# Patient Record
Sex: Male | Born: 1945
Health system: Southern US, Community
[De-identification: ages and names within clinical notes are randomized; demographics above are authoritative.]

## PROBLEM LIST (undated history)

## (undated) DIAGNOSIS — I2510 Atherosclerotic heart disease of native coronary artery without angina pectoris: Secondary | ICD-10-CM

## (undated) DIAGNOSIS — C801 Malignant (primary) neoplasm, unspecified: Secondary | ICD-10-CM

## (undated) DIAGNOSIS — Z8673 Personal history of transient ischemic attack (TIA), and cerebral infarction without residual deficits: Secondary | ICD-10-CM

## (undated) DIAGNOSIS — R0602 Shortness of breath: Secondary | ICD-10-CM

## (undated) DIAGNOSIS — E785 Hyperlipidemia, unspecified: Secondary | ICD-10-CM

## (undated) DIAGNOSIS — C61 Malignant neoplasm of prostate: Secondary | ICD-10-CM

## (undated) DIAGNOSIS — I10 Essential (primary) hypertension: Secondary | ICD-10-CM

## (undated) DIAGNOSIS — I998 Other disorder of circulatory system: Secondary | ICD-10-CM

## (undated) DIAGNOSIS — I480 Paroxysmal atrial fibrillation: Secondary | ICD-10-CM

## (undated) DIAGNOSIS — D075 Carcinoma in situ of prostate: Secondary | ICD-10-CM

## (undated) DIAGNOSIS — E119 Type 2 diabetes mellitus without complications: Secondary | ICD-10-CM

## (undated) DIAGNOSIS — E669 Obesity, unspecified: Secondary | ICD-10-CM

## (undated) DIAGNOSIS — R002 Palpitations: Secondary | ICD-10-CM

## (undated) HISTORY — DX: Shortness of breath: R06.02

## (undated) HISTORY — DX: Essential (primary) hypertension: I10

## (undated) HISTORY — DX: Other disorder of circulatory system: I99.8

## (undated) HISTORY — DX: Carcinoma in situ of prostate: D07.5

## (undated) HISTORY — DX: Obesity, unspecified: E66.9

## (undated) HISTORY — DX: Personal history of transient ischemic attack (TIA), and cerebral infarction without residual deficits: Z86.73

## (undated) HISTORY — DX: Hyperlipidemia, unspecified: E78.5

## (undated) HISTORY — DX: Paroxysmal atrial fibrillation: I48.0

## (undated) HISTORY — DX: Palpitations: R00.2

---

## 2016-10-24 HISTORY — PX: PROSTATE SURGERY: SHX751

## 2017-04-24 ENCOUNTER — Telehealth: Payer: Self-pay

## 2017-04-24 ENCOUNTER — Telehealth: Payer: Self-pay | Admitting: Cardiovascular Disease

## 2017-04-24 NOTE — Telephone Encounter (Signed)
Received records from [place] for appointment on 05/13/17 with Dr Fletcher Anon.  Records put with Dr Tyrell Antonio schedule for 05/13/17. lp

## 2017-04-24 NOTE — Telephone Encounter (Signed)
SENT NOTES BY COURIER AND FAXED

## 2017-04-29 ENCOUNTER — Telehealth: Payer: Self-pay | Admitting: Cardiovascular Disease

## 2017-04-29 NOTE — Telephone Encounter (Signed)
04/29/17-Received records from [place] for upcoming appointment on 05/13/17 @ 9:00am with Dr. Fletcher Anon. Records given to [facility] in Medical Records. ab

## 2017-05-02 ENCOUNTER — Telehealth: Payer: Self-pay | Admitting: Cardiovascular Disease

## 2017-05-02 NOTE — Telephone Encounter (Signed)
New message   Pt calling and states there is an important part of his records that he needs to make sure was received. Requests a call back.

## 2017-05-06 ENCOUNTER — Ambulatory Visit (INDEPENDENT_AMBULATORY_CARE_PROVIDER_SITE_OTHER): Payer: Medicare PPO | Admitting: Cardiovascular Disease

## 2017-05-06 ENCOUNTER — Encounter: Payer: Self-pay | Admitting: Cardiovascular Disease

## 2017-05-06 VITALS — BP 122/76 | HR 67 | Ht 65.0 in | Wt 247.8 lb

## 2017-05-06 DIAGNOSIS — I480 Paroxysmal atrial fibrillation: Secondary | ICD-10-CM

## 2017-05-06 DIAGNOSIS — E785 Hyperlipidemia, unspecified: Secondary | ICD-10-CM

## 2017-05-06 DIAGNOSIS — I10 Essential (primary) hypertension: Secondary | ICD-10-CM | POA: Diagnosis not present

## 2017-05-06 MED ORDER — RIVAROXABAN 20 MG PO TABS: 20.0000 mg | ORAL_TABLET | Freq: Every day | ORAL | 5 refills | Status: DC

## 2017-05-06 NOTE — Patient Instructions (Signed)
Medication Instructions:  Your physician recommends that you continue on your current medications as directed. Please refer to the Current Medication list given to you today.  Follow-Up: Your physician wants you to follow-up in: 6 MONTHS with Dr. Arida. You will receive a reminder letter in the mail two months in advance. If you don't receive a letter, please call our office to schedule the follow-up appointment.   Any Other Special Instructions Will Be Listed Below (If Applicable).     If you need a refill on your cardiac medications before your next appointment, please call your pharmacy.   

## 2017-05-06 NOTE — Progress Notes (Signed)
Cardiology Office Note   Date:  05/06/2017   ID:  Steven Bury MD, DOB April 15, 1946, MRN [id number]  PCP:  Seward Carol, MD  Cardiologist:  New  No chief complaint on file.     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 He moved from [place]. He is a retired Software engineer. He has chronic medical conditions that include prostate cancer, essential hypertension, DM, previous TIA, hyperlipidemia and obesity. He had his previous cardiovascular care in [place]. He started having palpitations earlier this year with fatigue. He was initially seen by general cardiology with negative Holter monitor. He had a stress echocardiogram done which showed borderline ST depression in the inferior leads with no convincing evidence of ischemia. EF was reported to be normal. He continued to have palpitations. He was seen by EP (Dr. Adline Potter) in May of this year for abnormal Zio monitor. It showed multiple activation due to atrial flutter and fibrillation with a maximum heart rate of 250 bpm. Overall burden was 3%. He improved after switching carvedilol to metoprolol. It was recommended that he proceed with ablation of atrial flutter and atrial fibrillation.  Since switching to metoprolol, he felt significantly better with no palpitations at this time. No chest pain. He has chronic exertional dyspnea with no orthopnea or PND.    Past Medical History:  Diagnosis Date  . Carcinoma in situ of prostate   . History of transient ischemic attack (TIA)   . Hyperlipidemia   . Hypertension   . Ischemia   . Obesity   . PAF (paroxysmal atrial fibrillation) ([place])   . Palpitations   . SOB (shortness of breath)     Past Surgical History:  Procedure Laterality Date  . PROSTATE SURGERY  10/2016   Seeds implanted     Current Outpatient Prescriptions  Medication Sig Dispense Refill  . acetaminophen (TYLENOL) 325 MG tablet  Take 325 mg by mouth every 6 (six) hours as needed.    Marland Kitchen atorvastatin (LIPITOR) 20 MG tablet Take 20 mg by mouth daily.    Marland Kitchen lisinopril-hydrochlorothiazide (PRINZIDE,ZESTORETIC) 20-25 MG tablet Take 1 tablet by mouth daily.    . metFORMIN (GLUCOPHAGE) 500 MG tablet Take 500 mg by mouth 2 (two) times daily with a meal.    . metoprolol succinate (TOPROL-XL) 100 MG 24 hr tablet Take 100 mg by mouth daily. Take with or immediately following a meal.    . montelukast (SINGULAIR) 4 MG PACK Take 4 mg by mouth at bedtime.    . Multiple Vitamin (MULTIVITAMIN WITH MINERALS) TABS tablet Take 1 tablet by mouth daily.    . rivaroxaban (XARELTO) 20 MG TABS tablet Take 1 tablet (20 mg total) by mouth daily with supper. 30 tablet 5  . tamsulosin (FLOMAX) 0.4 MG CAPS capsule Take 0.4 mg by mouth daily.     No current facility-administered medications for this visit.     Allergies:   Patient has no known allergies.    Social History:  The patient  reports that he has never smoked. He has never used smokeless tobacco.   Family History:  The patient's family history includes Breast cancer in his mother; Diabetes in his father; Stroke in his father.    ROS:  Please see the history of present illness.   Otherwise, review of systems are positive for none.   All other systems are reviewed and negative.    PHYSICAL EXAM: VS:  BP 122/76 (BP Location: Right Arm, Cuff Size: Normal)   Pulse 67   Ht 5\' 5"  (1.651 m)   Wt 247 lb 12.8 oz (112.4 kg)   BMI 41.24 kg/m  , BMI Body mass index is 41.24 kg/m. GEN: Well nourished, well developed, in no acute distress  HEENT: normal  Neck: no JVD, carotid bruits, or masses Cardiac: RRR; no murmurs, rubs, or gallops,no edema  Respiratory:  clear to auscultation bilaterally, normal work of breathing GI: soft, nontender, nondistended, + BS MS: no deformity or atrophy  Skin: warm and dry, no rash Neuro:  Strength and sensation are intact Psych: euthymic mood, full  affect   EKG:  EKG is ordered today. The ekg ordered today demonstrates sinus rhythm with first-degree AV block. No significant ST or T wave changes.   Recent Labs: No results found for requested labs within last 8760 hours.    Lipid Panel No results found for: CHOL, TRIG, HDL, CHOLHDL, VLDL, LDLCALC, LDLDIRECT    Wt Readings from Last 3 Encounters:  05/06/17 247 lb 12.8 oz (112.4 kg)      Other studies Reviewed: Additional studies/ records that were reviewed today include: : Previous cardiac records from [place]. Review of the above records demonstrates:  Summarized above.  30 minutes was spent on reviewing his records.  PAD Screen 05/06/2017  Previous PAD dx? No  Previous surgical procedure? No  Pain with walking? No  Feet/toe relief with dangling? No  Painful, non-healing ulcers? No  Extremities discolored? No      ASSESSMENT AND PLAN:  1.  Paroxysmal atrial fibrillation/flutter: The burden has improved significantly since he was switched to metoprolol and currently he does not appear to be symptomatic. He needs to stay on long-term anticoagulation given high CHADS VAsc score. I discussed the option of referral to electrophysiology to consider ablation versus continued medical therapy. It appears that he had very good response with metoprolol and given that he is asymptomatic at the present time, I think it's reasonable continue medical therapy for now. He is going to have a physical in the near future and I recommend checking at least CBC and basic metabolic profile given that he is on anticoagulation.  2. Essential hypertension: Blood pressure is controlled on metoprolol and lisinopril hydrochlorothiazide.  3. Hyperlipidemia: Currently on atorvastatin 20 mg daily with a target LDL of less than 70 given diabetes.   Disposition:   FU with me in 6 months  Signed,  Kathlyn Sacramento, MD  05/06/2017 5:41 PM    [place]

## 2017-05-13 ENCOUNTER — Ambulatory Visit: Payer: Self-pay | Admitting: Cardiovascular Disease

## 2017-09-23 DIAGNOSIS — H409 Unspecified glaucoma: Secondary | ICD-10-CM

## 2017-09-23 HISTORY — DX: Unspecified glaucoma: H40.9

## 2017-10-24 DIAGNOSIS — C61 Malignant neoplasm of prostate: Secondary | ICD-10-CM | POA: Diagnosis not present

## 2017-10-24 DIAGNOSIS — I4891 Unspecified atrial fibrillation: Secondary | ICD-10-CM | POA: Diagnosis not present

## 2017-10-24 DIAGNOSIS — Z0000 Encounter for general adult medical examination without abnormal findings: Secondary | ICD-10-CM | POA: Diagnosis not present

## 2017-10-24 DIAGNOSIS — R413 Other amnesia: Secondary | ICD-10-CM | POA: Diagnosis not present

## 2017-10-24 DIAGNOSIS — R8290 Unspecified abnormal findings in urine: Secondary | ICD-10-CM | POA: Diagnosis not present

## 2017-10-24 DIAGNOSIS — Z1389 Encounter for screening for other disorder: Secondary | ICD-10-CM | POA: Diagnosis not present

## 2017-10-24 DIAGNOSIS — Z23 Encounter for immunization: Secondary | ICD-10-CM | POA: Diagnosis not present

## 2017-10-24 DIAGNOSIS — E119 Type 2 diabetes mellitus without complications: Secondary | ICD-10-CM | POA: Diagnosis not present

## 2017-10-24 DIAGNOSIS — I10 Essential (primary) hypertension: Secondary | ICD-10-CM | POA: Diagnosis not present

## 2017-10-24 DIAGNOSIS — R350 Frequency of micturition: Secondary | ICD-10-CM | POA: Diagnosis not present

## 2017-10-31 DIAGNOSIS — C61 Malignant neoplasm of prostate: Secondary | ICD-10-CM | POA: Diagnosis not present

## 2017-10-31 DIAGNOSIS — N3000 Acute cystitis without hematuria: Secondary | ICD-10-CM | POA: Diagnosis not present

## 2017-11-13 DIAGNOSIS — C61 Malignant neoplasm of prostate: Secondary | ICD-10-CM | POA: Diagnosis not present

## 2017-11-13 DIAGNOSIS — R300 Dysuria: Secondary | ICD-10-CM | POA: Diagnosis not present

## 2017-11-13 DIAGNOSIS — R413 Other amnesia: Secondary | ICD-10-CM | POA: Diagnosis not present

## 2017-11-13 DIAGNOSIS — Z923 Personal history of irradiation: Secondary | ICD-10-CM | POA: Diagnosis not present

## 2017-11-18 ENCOUNTER — Encounter (HOSPITAL_COMMUNITY): Payer: Self-pay | Admitting: Emergency Medicine

## 2017-11-18 ENCOUNTER — Emergency Department (HOSPITAL_COMMUNITY)
Admission: EM | Admit: 2017-11-18 | Discharge: 2017-11-18 | Disposition: A | Discharge status: Home / Self Care | Payer: Medicare PPO | Attending: Emergency Medicine | Admitting: Emergency Medicine

## 2017-11-18 DIAGNOSIS — Z7901 Long term (current) use of anticoagulants: Secondary | ICD-10-CM | POA: Insufficient documentation

## 2017-11-18 DIAGNOSIS — I10 Essential (primary) hypertension: Secondary | ICD-10-CM | POA: Diagnosis not present

## 2017-11-18 DIAGNOSIS — R300 Dysuria: Secondary | ICD-10-CM | POA: Diagnosis present

## 2017-11-18 DIAGNOSIS — N3000 Acute cystitis without hematuria: Secondary | ICD-10-CM | POA: Diagnosis not present

## 2017-11-18 DIAGNOSIS — Z7984 Long term (current) use of oral hypoglycemic drugs: Secondary | ICD-10-CM | POA: Insufficient documentation

## 2017-11-18 DIAGNOSIS — Z79899 Other long term (current) drug therapy: Secondary | ICD-10-CM | POA: Insufficient documentation

## 2017-11-18 DIAGNOSIS — R3914 Feeling of incomplete bladder emptying: Secondary | ICD-10-CM | POA: Diagnosis not present

## 2017-11-18 LAB — URINALYSIS, ROUTINE W REFLEX MICROSCOPIC
Bilirubin Urine: NEGATIVE
GLUCOSE, UA: NEGATIVE mg/dL
Ketones, ur: NEGATIVE mg/dL
NITRITE: POSITIVE — AB
PH: 5 (ref 5.0–8.0)
Protein, ur: 30 mg/dL — AB
Specific Gravity, Urine: 1.02 (ref 1.005–1.030)
Squamous Epithelial / LPF: NONE SEEN

## 2017-11-18 MED ORDER — CEFDINIR 300 MG PO CAPS: 300.0000 mg | ORAL_CAPSULE | Freq: Two times a day (BID) | ORAL | 0 refills | Status: DC

## 2017-11-18 MED ORDER — TRAMADOL HCL 50 MG PO TABS: 50.0000 mg | ORAL_TABLET | Freq: Four times a day (QID) | ORAL | 0 refills | Status: DC | PRN

## 2017-11-18 NOTE — ED Provider Notes (Addendum)
[facility] DEPT Provider Note   CSN: [id number] Arrival date & time: 11/18/17  1728    History   Chief Complaint Chief Complaint  Patient presents with  . 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 PMH significant for prostate cancer, hx of TIA, HTN, HLD, PAF on Xarelto. He states that ever since he had radioactive seed implants placed a year ago he's developed radiation urethritis and has recurrent UTIs. For the past month he's had a UTI. A culture done three weeks ago grew out Klebsiella. He sees Dr. Alinda Money with Urology and he's tried Cipro, Azithromycin, Doxy, Bactrim with no relief. He went to his urologists office today and had another urine culture collected. He was given rx for Bactrim. When he went home he had so much pain that he decided to come to the ED. He also spoke with Dr. Jess Barters who is on call at [facility] and he recommended for him to come to the ED for pain control. He denies fever, chills, flank pain, abdominal pain, N/V. He is taking Flomax and Pyridium for urine flow.  HPI  Past Medical History:  Diagnosis Date  . Carcinoma in situ of prostate   . History of transient ischemic attack (TIA)   . Hyperlipidemia   . Hypertension   . Ischemia   . Obesity   . PAF (paroxysmal atrial fibrillation) ([place])   . Palpitations   . SOB (shortness of breath)     There are no active problems to display for this patient.   Past Surgical History:  Procedure Laterality Date  . PROSTATE SURGERY  10/2016   Seeds implanted       Home Medications    Prior to Admission medications   Medication Sig Start Date End Date Taking? Authorizing Provider  acetaminophen (TYLENOL) 500 MG tablet Take 1,000 mg by mouth every 8 (eight) hours as needed for moderate pain.   Yes [provider]  atorvastatin (LIPITOR) 20 MG tablet Take 20 mg by mouth daily.   Yes [provider]  co-enzyme Q-10 30  MG capsule Take 30 mg by mouth daily.   Yes [provider]  lisinopril-hydrochlorothiazide (PRINZIDE,ZESTORETIC) 20-25 MG tablet Take 1 tablet by mouth daily.   Yes [provider]  metFORMIN (GLUCOPHAGE) 500 MG tablet Take 500 mg by mouth 2 (two) times daily with a meal.   Yes [provider]  metoprolol succinate (TOPROL-XL) 100 MG 24 hr tablet Take 100 mg by mouth daily. Take with or immediately following a meal.   Yes [provider]  Multiple Vitamin (MULTIVITAMIN WITH MINERALS) TABS tablet Take 1 tablet by mouth daily.   Yes [provider]  Nutritional Supplements (ANTI-INFLAMMATORY ENZYME) CAPS Take 1 capsule by mouth daily.   Yes [provider]  Omega-3 Fatty Acids (FISH OIL) 1000 MG CAPS Take 1 capsule by mouth daily.   Yes [provider]  rivaroxaban (XARELTO) 20 MG TABS tablet Take 1 tablet (20 mg total) by mouth daily with supper. 05/06/17  Yes Wellington Hampshire, MD  tamsulosin (FLOMAX) 0.4 MG CAPS capsule Take 0.4 mg by mouth 2 (two) times daily.    Yes [provider]  VENTOLIN HFA 108 (90 Base) MCG/ACT inhaler INL 1-2 PFS PO Q 6 H PRN SOB AND WHEEZING 08/14/17   [provider]    Family History Family History  Problem Relation Age of Onset  . Breast cancer  Mother   . Diabetes Father   . Stroke Father     Social History Social History   Tobacco Use  . Smoking status: Never Smoker  . Smokeless tobacco: Never Used  Substance Use Topics  . Alcohol use: Not on file  . Drug use: Not on file     Allergies   Patient has no known allergies.   Review of Systems Review of Systems  Constitutional: Negative for chills and fever.  Gastrointestinal: Negative for abdominal pain, nausea and vomiting.  Genitourinary: Positive for dysuria and frequency. Negative for flank pain, penile pain and testicular pain.  All other systems reviewed and are negative.    Physical Exam Updated Vital  Signs BP 133/85 (BP Location: Left Arm)   Pulse 77   Temp 98.1 F (36.7 C) (Oral)   Resp 18   SpO2 94%   Physical Exam  Constitutional: He is oriented to person, place, and time. He appears well-developed and well-nourished. No distress.  Obese. Calm, cooperative. Well-appearing  HENT:  Head: Normocephalic and atraumatic.  Eyes: Conjunctivae are normal. Pupils are equal, round, and reactive to light. Right eye exhibits no discharge. Left eye exhibits no discharge. No scleral icterus.  Neck: Normal range of motion.  Cardiovascular: Normal rate and regular rhythm.  Pulmonary/Chest: Effort normal and breath sounds normal. No respiratory distress.  Abdominal: Soft. Bowel sounds are normal. He exhibits no distension. There is no tenderness.  No CVA tenderness  Neurological: He is alert and oriented to person, place, and time.  Skin: Skin is warm and dry.  Psychiatric: He has a normal mood and affect. His behavior is normal.  Nursing note and vitals reviewed.    ED Treatments / Results  Labs (all labs ordered are listed, but only abnormal results are displayed) Labs Reviewed  URINALYSIS, ROUTINE W REFLEX MICROSCOPIC - Abnormal; Notable for the following components:      Result Value   Color, Urine AMBER (*)    APPearance CLOUDY (*)    Hgb urine dipstick MODERATE (*)    Protein, ur 30 (*)    Nitrite POSITIVE (*)    Leukocytes, UA SMALL (*)    Bacteria, UA MANY (*)    All other components within normal limits    EKG  EKG Interpretation None       Radiology No results found.  Procedures Procedures (including critical care time)  Medications Ordered in ED Medications - No data to display   Initial Impression / Assessment and Plan / ED Course  I have reviewed the triage vital signs and the nursing notes.  Pertinent labs & imaging results that were available during my care of the patient were reviewed by me and considered in my medical decision making (see chart for  details).  72 year old male presents with recurrent UTI. He is well-appearing. Vitals are normal. Exam is unremarkable. Discussed with Dr. Billy Fischer. Will consult urology.  10:09 PM I spoke with Dr. Jess Barters who thinks his pain is multifactorial due to bladder spasms, infection, and mild urinary retention. He recommends Omnicef and office follow up. He's already given urine for a culture today. Will give rx for Omnicef and Tramadol and advised f/u with Urology.  Final Clinical Impressions(s) / ED Diagnoses   Final diagnoses:  Acute cystitis without hematuria    ED Discharge Orders    None         Recardo Evangelist, PA-C 11/18/17 2235    Gareth Morgan, MD 11/20/17 [phone]

## 2017-11-18 NOTE — ED Triage Notes (Signed)
Pt reports he has been having burning with urination for the past month. Has been to urology several times for same and tried several abx without relief. Pt having difficulty sleeping and relaxing due to pain. Called urology today, but was told to come here because the office was closed.

## 2017-11-18 NOTE — Discharge Instructions (Signed)
Take Cefdinir twice daily for one week Take Tramadol as needed for moderate-severe pain Follow up with Urology

## 2017-11-20 DIAGNOSIS — C61 Malignant neoplasm of prostate: Secondary | ICD-10-CM | POA: Diagnosis not present

## 2017-11-25 DIAGNOSIS — N3000 Acute cystitis without hematuria: Secondary | ICD-10-CM | POA: Diagnosis not present

## 2017-11-25 DIAGNOSIS — R3914 Feeling of incomplete bladder emptying: Secondary | ICD-10-CM | POA: Diagnosis not present

## 2017-11-27 DIAGNOSIS — I10 Essential (primary) hypertension: Secondary | ICD-10-CM | POA: Diagnosis not present

## 2017-11-27 DIAGNOSIS — Z8673 Personal history of transient ischemic attack (TIA), and cerebral infarction without residual deficits: Secondary | ICD-10-CM | POA: Diagnosis not present

## 2017-11-27 DIAGNOSIS — E119 Type 2 diabetes mellitus without complications: Secondary | ICD-10-CM | POA: Diagnosis not present

## 2017-11-27 DIAGNOSIS — Z923 Personal history of irradiation: Secondary | ICD-10-CM | POA: Diagnosis not present

## 2017-11-27 DIAGNOSIS — N35916 Unspecified urethral stricture, male, overlapping sites: Secondary | ICD-10-CM | POA: Diagnosis not present

## 2017-11-27 DIAGNOSIS — R3915 Urgency of urination: Secondary | ICD-10-CM | POA: Diagnosis not present

## 2017-11-27 DIAGNOSIS — N99112 Postprocedural membranous urethral stricture: Secondary | ICD-10-CM | POA: Diagnosis not present

## 2017-11-27 DIAGNOSIS — N35919 Unspecified urethral stricture, male, unspecified site: Secondary | ICD-10-CM | POA: Diagnosis not present

## 2017-11-27 DIAGNOSIS — Z8546 Personal history of malignant neoplasm of prostate: Secondary | ICD-10-CM | POA: Diagnosis not present

## 2017-11-28 ENCOUNTER — Encounter: Payer: Self-pay | Admitting: Neurology

## 2017-12-01 DIAGNOSIS — C61 Malignant neoplasm of prostate: Secondary | ICD-10-CM | POA: Diagnosis not present

## 2017-12-02 ENCOUNTER — Telehealth: Payer: Self-pay | Admitting: Cardiovascular Disease

## 2017-12-02 NOTE — Telephone Encounter (Signed)
° °  [name] Pre-operative Risk Assessment    Request for surgical clearance:  1. What type of surgery is being performed? Colonoscopy   2. When is this surgery scheduled? 12/18/17  3. What type of clearance is required (medical clearance vs. Pharmacy clearance to hold med vs. Both)? Pharmacy   4. Are there any medications that need to be held prior to surgery and how long? Asking about Xarelto    5. Practice name and name of physician performing surgery? [name] Dr [facility] [name]   6. What is your office phone and fax number? Fax [phone] Phone [phone]  7. Anesthesia type (None, local, MAC, general) ? Not listed

## 2017-12-02 NOTE — Telephone Encounter (Signed)
Patient last seen by Dr. Fletcher Anon- 05/06/2017. (He is seen at the [facility] office)  Dx- A-fib/ history of TIA  Patient is having a screening colonoscopy on 12/18/17. In regards to [place]- "can the patient temporarily stop the above medication before his/ her procedure, or after the procedure if polyps are removed."

## 2017-12-05 NOTE — Telephone Encounter (Signed)
Copy of phone encounter faxed to Dr. Pamalee Leyden [facility] at [phone]- fax confirmation received.

## 2017-12-05 NOTE — Telephone Encounter (Signed)
Hold Xarelto 2 days before colonoscopy and for a few days after the procedure if polyps are removed.

## 2017-12-14 ENCOUNTER — Emergency Department (HOSPITAL_COMMUNITY)
Admission: EM | Admit: 2017-12-14 | Discharge: 2017-12-14 | Disposition: A | Discharge status: Home / Self Care | Payer: Medicare PPO | Attending: Emergency Medicine | Admitting: Emergency Medicine

## 2017-12-14 DIAGNOSIS — Z7984 Long term (current) use of oral hypoglycemic drugs: Secondary | ICD-10-CM | POA: Diagnosis not present

## 2017-12-14 DIAGNOSIS — R339 Retention of urine, unspecified: Secondary | ICD-10-CM | POA: Diagnosis present

## 2017-12-14 DIAGNOSIS — Z79899 Other long term (current) drug therapy: Secondary | ICD-10-CM | POA: Insufficient documentation

## 2017-12-14 DIAGNOSIS — I10 Essential (primary) hypertension: Secondary | ICD-10-CM | POA: Diagnosis not present

## 2017-12-14 DIAGNOSIS — Y828 Other medical devices associated with adverse incidents: Secondary | ICD-10-CM | POA: Insufficient documentation

## 2017-12-14 DIAGNOSIS — Z8546 Personal history of malignant neoplasm of prostate: Secondary | ICD-10-CM | POA: Insufficient documentation

## 2017-12-14 DIAGNOSIS — T83091A Other mechanical complication of indwelling urethral catheter, initial encounter: Secondary | ICD-10-CM | POA: Insufficient documentation

## 2017-12-14 DIAGNOSIS — R300 Dysuria: Secondary | ICD-10-CM | POA: Diagnosis not present

## 2017-12-14 DIAGNOSIS — T83098A Other mechanical complication of other indwelling urethral catheter, initial encounter: Secondary | ICD-10-CM | POA: Diagnosis not present

## 2017-12-14 MED ORDER — OXYCODONE-ACETAMINOPHEN 5-325 MG PO TABS
1.0000 | ORAL_TABLET | Freq: Once | ORAL | Status: AC
Start: 2017-12-14 — End: 2017-12-14
  Administered 2017-12-14: 1 via ORAL
  Filled 2017-12-14: qty 1

## 2017-12-14 MED ORDER — SULFAMETHOXAZOLE-TRIMETHOPRIM 800-160 MG PO TABS
1.0000 | ORAL_TABLET | Freq: Once | ORAL | Status: AC
  Administered 2017-12-14: 1 via ORAL
  Filled 2017-12-14: qty 1

## 2017-12-14 NOTE — Discharge Instructions (Addendum)
Flush your catheter twice daily.  Get rechecked immediately if you develop fevers, can't urinate, uncontrolled pain or new concerning symptoms.

## 2017-12-14 NOTE — ED Notes (Signed)
Unable to flush urinary foley due to resistance. MD called to bedside for attempt.

## 2017-12-14 NOTE — ED Triage Notes (Signed)
Pt reports having a hx of prostate cancer. Pt contacted urology this morning and stated he had decreased output in his foley. Pt states he is unable to sit down due to pain.

## 2017-12-14 NOTE — ED Provider Notes (Signed)
[facility] DEPT Provider Note   CSN: [id number] Arrival date & time: 12/14/17  1339     History   Chief Complaint Chief Complaint  Patient presents with  . 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  The history is provided by the patient. No language interpreter was used.    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 He has a 12 French Foley catheter in place that was placed on March 7 in the operating room due to urethral stricture. He has a history of prostate cancer status post radiation treatment as well as atrial fibrillation on the Xarelto therapy. Since 5 AM he has been unable to pass urine through his Foley catheter and reports bladder spasms about every 15 minutes since that time. Prior to this his catheter was draining without difficulty. He did attempt to flush it with 30 mL of normal saline prior to ED arrival with no improvement in output. He is scheduled for suprapubic catheter placement next week. He denies any fevers, vomiting. The symptoms are severe and constant in nature.  Past Medical History:  Diagnosis Date  . Carcinoma in situ of prostate   . History of transient ischemic attack (TIA)   . Hyperlipidemia   . Hypertension   . Ischemia   . Obesity   . PAF (paroxysmal atrial fibrillation) ([place])   . Palpitations   . SOB (shortness of breath)     There are no active problems to display for this patient.   Past Surgical History:  Procedure Laterality Date  . PROSTATE SURGERY  10/2016   Seeds implanted        Home Medications    Prior to Admission medications   Medication Sig Start Date End Date Taking? Authorizing Provider  acetaminophen (TYLENOL) 500 MG tablet Take 1,000 mg by mouth every 8 (eight) hours as needed for moderate pain.   Yes [provider]  atorvastatin (LIPITOR) 20 MG tablet Take 20 mg by mouth daily.   Yes [provider]  co-enzyme Q-10 30  MG capsule Take 30 mg by mouth daily.   Yes [provider]  lisinopril-hydrochlorothiazide (PRINZIDE,ZESTORETIC) 20-25 MG tablet Take 1 tablet by mouth daily.   Yes [provider]  metFORMIN (GLUCOPHAGE) 500 MG tablet Take 500 mg by mouth 2 (two) times daily with a meal.   Yes [provider]  metoprolol succinate (TOPROL-XL) 100 MG 24 hr tablet Take 100 mg by mouth daily. Take with or immediately following a meal.   Yes [provider]  oxyCODONE (OXY IR/ROXICODONE) 5 MG immediate release tablet Take 5 mg by mouth every 4 (four) hours as needed for pain. 11/27/17  Yes [provider]  rivaroxaban (XARELTO) 20 MG TABS tablet Take 1 tablet (20 mg total) by mouth daily with supper. 05/06/17  Yes Wellington Hampshire, MD  sulfamethoxazole-trimethoprim (BACTRIM DS,SEPTRA DS) 800-160 MG tablet Take 1 tablet by mouth once.   Yes [provider]  tamsulosin (FLOMAX) 0.4 MG CAPS capsule Take 0.4 mg by mouth 2 (two) times daily.    Yes [provider]  traMADol (ULTRAM) 50 MG tablet Take 1 tablet (50 mg total) by mouth every 6 (six) hours as needed. 11/18/17  Yes Recardo Evangelist, PA-C  Turmeric 500 MG TABS Take 1,000 mg by mouth daily.    Yes [provider]  VENTOLIN HFA 108 (90 Base) MCG/ACT inhaler INL 1-2 PFS PO Q 6  H PRN SOB AND WHEEZING 08/14/17  Yes [provider]  cefdinir (OMNICEF) 300 MG capsule Take 1 capsule (300 mg total) by mouth 2 (two) times daily. Patient not taking: Reported on 12/14/2017 11/18/17   Recardo Evangelist, PA-C  Omega-3 Fatty Acids (FISH OIL) 1000 MG CAPS Take 1 capsule by mouth daily.    [provider]    Family History Family History  Problem Relation Age of Onset  . Breast cancer Mother   . Diabetes Father   . Stroke Father     Social History Social History   Tobacco Use  . Smoking status: Never Smoker  . Smokeless tobacco: Never Used  Substance Use Topics  . Alcohol use:  Not on file  . Drug use: Not on file     Allergies   Patient has no known allergies.   Review of Systems Review of Systems  All other systems reviewed and are negative.    Physical Exam Updated Vital Signs BP (!) 95/49   Pulse 62   Resp 18   SpO2 96%   Physical Exam  Constitutional: He is oriented to person, place, and time. He appears well-developed and well-nourished. He appears distressed.  Pacing the room. Uncomfortable appearing  HENT:  Head: Normocephalic and atraumatic.  Cardiovascular: Normal rate and regular rhythm.  No murmur heard. Pulmonary/Chest: Effort normal and breath sounds normal. No respiratory distress.  Abdominal: Soft. There is no rebound and no guarding.  Mild suprapubic tenderness  Genitourinary:  Genitourinary Comments: 12 French Foley catheter in place. A small amount of yellow urine in the bag. No urethral discharge.  Musculoskeletal: He exhibits no edema or tenderness.  Neurological: He is alert and oriented to person, place, and time.  Skin: Skin is warm and dry.  Psychiatric: He has a normal mood and affect. His behavior is normal.  Nursing note and vitals reviewed.    ED Treatments / Results  Labs (all labs ordered are listed, but only abnormal results are displayed) Labs Reviewed - No data to display  EKG None  Radiology No results found.  Procedures Procedures (including critical care time) EMERGENCY DEPARTMENT ULTRASOUND  Study: Limited Ultrasound of Bladder  INDICATIONS: to assess for urinary retention and/or bladder volume prior to urinary catheter Multiple views of the bladder were obtained in real-time in the transverse and longitudinal planes with a multi-frequency probe.  PERFORMED BY: Myself IMAGES ARCHIVED?: No LIMITATIONS:  Body habitus, discomfort INTERPRETATION: Moderate Volume 448mL      Medications Ordered in ED Medications  oxyCODONE-acetaminophen (PERCOCET/ROXICET) 5-325 MG per tablet 1 tablet (1  tablet Oral Given 12/14/17 1529)  sulfamethoxazole-trimethoprim (BACTRIM DS,SEPTRA DS) 800-160 MG per tablet 1 tablet (1 tablet Oral Given 12/14/17 1820)     Initial Impression / Assessment and Plan / ED Course  I have reviewed the triage vital signs and the nursing notes.  Pertinent labs & imaging results that were available during my care of the patient were reviewed by me and considered in my medical decision making (see chart for details).     Pt here for evaluation of decreased urinary output since early this morning, bladder spasms.  Beside US demonstrates acute urinary retention (initial with 600 in bladder).  Discussed with oncall Urologist, Dr. Lovena Neighbours - recommends flushing catheter.  Attempted to flush catheter multiple times.  Unable to flush but can draw back small amount of fluid.  After drawing back fluid patient did have slow flow of urine through his catheter.  His bladder  spasms resolved and he was able to rest comfortable.  He had about 300 mL out in foley bag.  Repeat bladder scan demonstrated some persistent fluid in bladder but significantly improved from previous.  Given bladder irrigation in ED Dr. Lovena Neighbours recommended one time dose of bactrim.  No historical features concerning for UTI or acute renal failure.  Discussed with patient importance of close Urology follow up, flushing catheter at home.  Return precautions discussed for evidence of recurrent obstruction, UTI.    Final Clinical Impressions(s) / ED Diagnoses   Final diagnoses:  Obstruction of Foley catheter, initial encounter [facility])    ED Discharge Orders    None       Quintella Reichert, MD 12/14/17 2020

## 2017-12-16 ENCOUNTER — Emergency Department (HOSPITAL_COMMUNITY): Payer: Medicare PPO

## 2017-12-16 ENCOUNTER — Encounter (HOSPITAL_COMMUNITY): Payer: Self-pay | Admitting: Obstetrics and Gynecology

## 2017-12-16 ENCOUNTER — Other Ambulatory Visit: Payer: Self-pay

## 2017-12-16 ENCOUNTER — Other Ambulatory Visit (HOSPITAL_COMMUNITY)
Admission: RE | Admit: 2017-12-16 | Discharge: 2017-12-16 | Disposition: A | Discharge status: Home / Self Care | Payer: Medicare PPO | Source: Ambulatory Visit

## 2017-12-16 ENCOUNTER — Emergency Department (HOSPITAL_COMMUNITY)
Admission: EM | Admit: 2017-12-16 | Discharge: 2017-12-16 | Disposition: A | Discharge status: Home / Self Care | Payer: Medicare PPO | Attending: Emergency Medicine | Admitting: Emergency Medicine

## 2017-12-16 DIAGNOSIS — R1084 Generalized abdominal pain: Secondary | ICD-10-CM | POA: Diagnosis not present

## 2017-12-16 DIAGNOSIS — E119 Type 2 diabetes mellitus without complications: Secondary | ICD-10-CM | POA: Insufficient documentation

## 2017-12-16 DIAGNOSIS — Z87448 Personal history of other diseases of urinary system: Secondary | ICD-10-CM

## 2017-12-16 DIAGNOSIS — R339 Retention of urine, unspecified: Secondary | ICD-10-CM | POA: Diagnosis not present

## 2017-12-16 DIAGNOSIS — Z8546 Personal history of malignant neoplasm of prostate: Secondary | ICD-10-CM | POA: Insufficient documentation

## 2017-12-16 DIAGNOSIS — N179 Acute kidney failure, unspecified: Secondary | ICD-10-CM | POA: Diagnosis not present

## 2017-12-16 DIAGNOSIS — R1030 Lower abdominal pain, unspecified: Secondary | ICD-10-CM | POA: Diagnosis not present

## 2017-12-16 DIAGNOSIS — Z09 Encounter for follow-up examination after completed treatment for conditions other than malignant neoplasm: Secondary | ICD-10-CM

## 2017-12-16 DIAGNOSIS — N35011 Post-traumatic bulbous urethral stricture: Secondary | ICD-10-CM | POA: Diagnosis not present

## 2017-12-16 DIAGNOSIS — Z7984 Long term (current) use of oral hypoglycemic drugs: Secondary | ICD-10-CM | POA: Insufficient documentation

## 2017-12-16 DIAGNOSIS — I10 Essential (primary) hypertension: Secondary | ICD-10-CM | POA: Diagnosis not present

## 2017-12-16 DIAGNOSIS — N3000 Acute cystitis without hematuria: Secondary | ICD-10-CM | POA: Diagnosis not present

## 2017-12-16 DIAGNOSIS — Z8673 Personal history of transient ischemic attack (TIA), and cerebral infarction without residual deficits: Secondary | ICD-10-CM | POA: Insufficient documentation

## 2017-12-16 DIAGNOSIS — Z466 Encounter for fitting and adjustment of urinary device: Secondary | ICD-10-CM | POA: Diagnosis not present

## 2017-12-16 DIAGNOSIS — R338 Other retention of urine: Secondary | ICD-10-CM | POA: Diagnosis not present

## 2017-12-16 DIAGNOSIS — Z79899 Other long term (current) drug therapy: Secondary | ICD-10-CM | POA: Diagnosis not present

## 2017-12-16 DIAGNOSIS — N35919 Unspecified urethral stricture, male, unspecified site: Secondary | ICD-10-CM | POA: Diagnosis not present

## 2017-12-16 HISTORY — DX: Type 2 diabetes mellitus without complications: E11.9

## 2017-12-16 HISTORY — DX: Malignant (primary) neoplasm, unspecified: C80.1

## 2017-12-16 HISTORY — DX: Malignant neoplasm of prostate: C61

## 2017-12-16 LAB — PROTIME-INR
INR: 1.26
Prothrombin Time: 15.7 seconds — ABNORMAL HIGH (ref 11.4–15.2)

## 2017-12-16 LAB — URINALYSIS, ROUTINE W REFLEX MICROSCOPIC
Bilirubin Urine: NEGATIVE
GLUCOSE, UA: NEGATIVE mg/dL
KETONES UR: NEGATIVE mg/dL
NITRITE: NEGATIVE
PH: 5 (ref 5.0–8.0)
Protein, ur: NEGATIVE mg/dL
SPECIFIC GRAVITY, URINE: 1.014 (ref 1.005–1.030)
Squamous Epithelial / LPF: NONE SEEN

## 2017-12-16 LAB — CBC WITH DIFFERENTIAL/PLATELET
BASOS PCT: 0 %
Basophils Absolute: 0 10*3/uL (ref 0.0–0.1)
Eosinophils Absolute: 0.1 10*3/uL (ref 0.0–0.7)
Eosinophils Relative: 0 %
HEMATOCRIT: 36.5 % — AB (ref 39.0–52.0)
Hemoglobin: 12 g/dL — ABNORMAL LOW (ref 13.0–17.0)
LYMPHS PCT: 11 %
Lymphs Abs: 1.4 10*3/uL (ref 0.7–4.0)
MCH: 29.9 pg (ref 26.0–34.0)
MCHC: 32.9 g/dL (ref 30.0–36.0)
MCV: 91 fL (ref 78.0–100.0)
Monocytes Absolute: 1.3 10*3/uL — ABNORMAL HIGH (ref 0.1–1.0)
Monocytes Relative: 10 %
NEUTROS ABS: 10 10*3/uL — AB (ref 1.7–7.7)
NEUTROS PCT: 79 %
Platelets: 307 10*3/uL (ref 150–400)
RBC: 4.01 MIL/uL — AB (ref 4.22–5.81)
RDW: 13.4 % (ref 11.5–15.5)
WBC: 12.7 10*3/uL — AB (ref 4.0–10.5)

## 2017-12-16 LAB — BASIC METABOLIC PANEL
ANION GAP: 15 (ref 5–15)
BUN: 47 mg/dL — ABNORMAL HIGH (ref 6–20)
CO2: 22 mmol/L (ref 22–32)
Calcium: 9.7 mg/dL (ref 8.9–10.3)
Chloride: 104 mmol/L (ref 101–111)
Creatinine, Ser: 1.69 mg/dL — ABNORMAL HIGH (ref 0.61–1.24)
GFR, EST AFRICAN AMERICAN: 46 mL/min — AB (ref >60)
GFR, EST NON AFRICAN AMERICAN: 39 mL/min — AB (ref >60)
GLUCOSE: 175 mg/dL — AB (ref 65–99)
POTASSIUM: 4.7 mmol/L (ref 3.5–5.1)
Sodium: 141 mmol/L (ref 135–145)

## 2017-12-16 MED ORDER — MIDAZOLAM HCL 2 MG/2ML IJ SOLN
INTRAMUSCULAR | Status: AC | PRN
  Administered 2017-12-16: 0.5 mg via INTRAVENOUS
  Administered 2017-12-16: 1 mg via INTRAVENOUS
  Administered 2017-12-16: 0.5 mg via INTRAVENOUS
  Administered 2017-12-16: 1 mg via INTRAVENOUS

## 2017-12-16 MED ORDER — MIDAZOLAM HCL 2 MG/2ML IJ SOLN
INTRAMUSCULAR | Status: AC
  Filled 2017-12-16: qty 6

## 2017-12-16 MED ORDER — SULFAMETHOXAZOLE-TRIMETHOPRIM 800-160 MG PO TABS: 1.0000 | ORAL_TABLET | Freq: Two times a day (BID) | ORAL | 0 refills | Status: AC

## 2017-12-16 MED ORDER — SODIUM CHLORIDE 0.9% FLUSH: 5.0000 mL | Freq: Three times a day (TID) | INTRAVENOUS | Status: DC

## 2017-12-16 MED ORDER — HYDROMORPHONE HCL 1 MG/ML IJ SOLN
1.0000 mg | Freq: Once | INTRAMUSCULAR | Status: AC
  Administered 2017-12-16: 1 mg via INTRAMUSCULAR
  Filled 2017-12-16: qty 1

## 2017-12-16 MED ORDER — BELLADONNA-OPIUM 16.2-30 MG RE SUPP: 30.0000 mg | Freq: Three times a day (TID) | RECTAL | 0 refills | Status: DC | PRN

## 2017-12-16 MED ORDER — SODIUM CHLORIDE 0.9 % IV BOLUS
1000.0000 mL | Freq: Once | INTRAVENOUS | Status: AC
  Administered 2017-12-16: 1000 mL via INTRAVENOUS

## 2017-12-16 MED ORDER — FENTANYL CITRATE (PF) 100 MCG/2ML IJ SOLN
INTRAMUSCULAR | Status: AC | PRN
  Administered 2017-12-16: 50 ug via INTRAVENOUS
  Administered 2017-12-16: 25 ug via INTRAVENOUS
  Administered 2017-12-16: 50 ug via INTRAVENOUS
  Administered 2017-12-16: 25 ug via INTRAVENOUS

## 2017-12-16 MED ORDER — CIPROFLOXACIN IN D5W 400 MG/200ML IV SOLN
400.0000 mg | Freq: Once | INTRAVENOUS | Status: AC
  Administered 2017-12-16: 400 mg via INTRAVENOUS
  Filled 2017-12-16: qty 200

## 2017-12-16 MED ORDER — BELLADONNA ALKALOIDS-OPIUM 16.2-60 MG RE SUPP: 1.0000 | Freq: Three times a day (TID) | RECTAL | Status: DC | PRN

## 2017-12-16 MED ORDER — FENTANYL CITRATE (PF) 100 MCG/2ML IJ SOLN
INTRAMUSCULAR | Status: AC
  Filled 2017-12-16: qty 6

## 2017-12-16 MED ORDER — HYDROMORPHONE HCL 1 MG/ML IJ SOLN
1.0000 mg | Freq: Once | INTRAMUSCULAR | Status: AC
  Administered 2017-12-16: 1 mg via INTRAVENOUS
  Filled 2017-12-16: qty 1

## 2017-12-16 NOTE — Sedation Documentation (Signed)
Pt states that his pain level is 10/10 and worse with laying down or sitting. Pain medication administered. Vitals stable at this time, MD Earleen Newport at beside

## 2017-12-16 NOTE — Sedation Documentation (Signed)
Patient is resting comfortably. 

## 2017-12-16 NOTE — ED Provider Notes (Signed)
72 yo M with h/o prostate CA with indwelling foley (8F), here with urinary retention. IR consulted, Urology consulted. Required OR with foley placement previously.   If pain controlled after IR, f/u with Dr. Francesca Jewett [phone] - Urologist at [facility].  Suprapubic catheter placed by IR uneventfully.  Patient tolerated well.  I reviewed his labs.  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  He has been given fluids here and is making good urine.  He will drink plenty of fluids at home and follow-up with his PCP and urologist.  I also discussed with his urologist Dr. Eli Hose.  Will place the patient on Bactrim, as well as give opium suppositories for bladder spasms.  Patient is very well-appearing and in no distress.   Duffy Bruce, MD 12/16/17 [phone]

## 2017-12-16 NOTE — Procedures (Signed)
Interventional Radiology Procedure Note  Procedure: CT guided suprapubic catheter.  76F catheter placed. .  Complications: None Recommendations:  - to gravity drain - His [facility] Urologist Dr. Francesca Jewett recommends removing the foley catheter now, though patient may not agree  - Follow up with [facility] urology as scheduled - 1 hour recovery before DC home - Currently is scheduled in 1 month with [facility]IR for upsize of the catheter, if not otherwise   Signed,  Dulcy Fanny. Earleen Newport, DO

## 2017-12-16 NOTE — Consult Note (Signed)
Chief Complaint: Patient was seen in consultation today for image guided suprapubic catheter placement Chief Complaint  Patient presents with  . 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  She has an existing 12 French Foley catheter in place since 11/27/17 due to urethral stricture.  Despite attempts at flushing Foley catheter only minimal amounts of urine have been passed.  Bladder scan revealed 700 cc urine.  Request now received for image guided suprapubic catheter placement.  Past Medical History:  Diagnosis Date  . Cancer ([place])   . Carcinoma in situ of prostate   . Diabetes mellitus without complication ([place])   . History of transient ischemic attack (TIA)   . Hyperlipidemia   . Hypertension   . Ischemia   . Obesity   . PAF (paroxysmal atrial fibrillation) ([place])   . Palpitations   . Prostate CA ([place])   . SOB (shortness of breath)     Past Surgical History:  Procedure Laterality Date  . PROSTATE SURGERY  10/2016   Seeds implanted    Allergies: Patient has no known allergies.  Medications: Prior to Admission medications   Medication Sig Start Date End Date Taking? Authorizing Provider  acetaminophen (TYLENOL) 500 MG tablet Take 1,000 mg by mouth every 8 (eight) hours as needed for moderate pain.   Yes [provider]  atorvastatin (LIPITOR) 20 MG tablet Take 20 mg by mouth daily.   Yes [provider]  co-enzyme Q-10 30 MG capsule Take 30 mg by mouth daily.   Yes [provider]  lisinopril-hydrochlorothiazide  (PRINZIDE,ZESTORETIC) 20-25 MG tablet Take 1 tablet by mouth daily.   Yes [provider]  metFORMIN (GLUCOPHAGE) 500 MG tablet Take 500 mg by mouth 2 (two) times daily with a meal.   Yes [provider]  metoprolol succinate (TOPROL-XL) 100 MG 24 hr tablet Take 100 mg by mouth daily. Take with or immediately following a meal.   Yes [provider]  Omega-3 Fatty Acids (FISH OIL) 1000 MG CAPS Take 1 capsule by mouth daily.   Yes [provider]  oxyCODONE (OXY IR/ROXICODONE) 5 MG immediate release tablet Take 5 mg by mouth every 4 (four) hours as needed for pain. 11/27/17  Yes [provider]  rivaroxaban (XARELTO) 20 MG TABS tablet Take 1 tablet (20 mg total) by mouth daily with supper. 05/06/17  Yes Wellington Hampshire, MD  sulfamethoxazole-trimethoprim (BACTRIM DS,SEPTRA DS) 800-160 MG tablet Take 1 tablet by mouth once.   Yes [provider]  tamsulosin (FLOMAX) 0.4 MG CAPS capsule Take 0.4 mg by mouth 2 (two) times daily.    Yes [provider]  traMADol (ULTRAM) 50 MG tablet Take 1 tablet (50 mg total) by mouth every 6 (six) hours as needed. 11/18/17  Yes Recardo Evangelist, PA-C  Turmeric 500 MG TABS Take 1,000 mg by mouth daily.    Yes [provider]  VENTOLIN HFA 108 (90 Base) MCG/ACT inhaler INL 1-2 PFS PO Q 6 H PRN SOB AND WHEEZING 08/14/17  Yes [provider]  cefdinir (OMNICEF) 300 MG capsule Take 1 capsule (300 mg total)  by mouth 2 (two) times daily. Patient not taking: Reported on 12/14/2017 11/18/17   Recardo Evangelist, PA-C     Family History  Problem Relation Age of Onset  . Breast cancer Mother   . Diabetes Father   . Stroke Father     Social History   Socioeconomic History  . Marital status: Married    Spouse name: Not on file  . Number of children: Not on file  . Years of education: Not on file  . Highest education level: Not on file  Occupational History  . Not on file  Social Needs  .  Financial resource strain: Not on file  . Food insecurity:    Worry: Not on file    Inability: Not on file  . Transportation needs:    Medical: Not on file    Non-medical: Not on file  Tobacco Use  . Smoking status: Never Smoker  . Smokeless tobacco: Never Used  Substance and Sexual Activity  . Alcohol use: Not on file  . Drug use: Not on file  . Sexual activity: Not on file  Lifestyle  . Physical activity:    Days per week: Not on file    Minutes per session: Not on file  . Stress: Not on file  Relationships  . Social connections:    Talks on phone: Not on file    Gets together: Not on file    Attends religious service: Not on file    Active member of club or organization: Not on file    Attends meetings of clubs or organizations: Not on file    Relationship status: Not on file  Other Topics Concern  . Not on file  Social History Narrative  . Not on file      Review of Systems denies  fever, headache, chest pain, cough, back pain, vomiting or abnormal bleeding.  He does have some dyspnea with exertion, tachycardia, nausea as well as diarrhea and pelvic discomfort/bladder spasms  Vital Signs: BP 137/82 (BP Location: Left Arm)   Pulse (!) 114   Resp 18   SpO2 92%   Physical Exam awake, alert.  Patient pacing the floor and very uncomfortable due to bladder spasms.  Chest clear to auscultation bilaterally.  Heart with tachycardic but regular rhythm.  Abdomen soft, positive bowel sounds, mild suprapubic tenderness, 12 French Foley in place with minimal amount of urine noted  Imaging: No results found.  Labs:  CBC: No results for input(s): WBC, HGB, HCT, PLT in the last 8760 hours.  COAGS: No results for input(s): INR, APTT in the last 8760 hours.  BMP: No results for input(s): NA, K, CL, CO2, GLUCOSE, BUN, CALCIUM, CREATININE, GFRNONAA, GFRAA in the last 8760 hours.  Invalid input(s): CMP  LIVER FUNCTION TESTS: No results for input(s): BILITOT, AST, ALT,  ALKPHOS, PROT, ALBUMIN in the last 8760 hours.  TUMOR MARKERS: No results for input(s): AFPTM, CEA, CA199, CHROMGRNA in the last 8760 hours.  Assessment and Plan:  72 y.o. male with past medical history significant for prostate cancer, status post radiation therapy and seed implantation, diabetes, atrial fibrillation on Xarelto-last dose this past Saturday, TIA, hypertension, hyperlipidemia recent presented to the ED with inability to pass urine Via Foley catheter.  She has an existing 12 French Foley catheter in place since 11/27/17 due to urethral stricture.  Despite attempts at flushing Foley catheter only minimal amounts of urine have been passed.  Bladder scan revealed 700 cc urine.  Request now  received for image guided suprapubic catheter placement.  Details/risks of procedure, including but not limited to, internal bleeding, infection, injury to adjacent structures, discussed with patient and wife with their understanding and consent.  Procedure scheduled for today as soon as possible.  Labs pend.  Thank you for this interesting consult.  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  A copy of this report was sent to the requesting provider on this date.  Electronically Signed: D. Rowe Robert, PA-C 12/16/2017, 4:33 PM   I spent a total of 25 minutes  in face to face in clinical consultation, greater than 50% of which was counseling/coordinating care for suprapubic catheter placement

## 2017-12-16 NOTE — ED Notes (Signed)
Bed: EE10 Expected date:  Expected time:  Means of arrival:  Comments: EMS-dysuria

## 2017-12-16 NOTE — ED Provider Notes (Signed)
[facility] DEPT Provider Note   CSN: [id number] Arrival date & time: 12/16/17  1109     History   Chief Complaint Chief Complaint  Patient presents with  . 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  History of prostate cancer s/p radiation with subsequent ureteral stricture s/p catheter placement (with difficulty under anesthesia in OR with 12 french catheter by urologist) by [facility] here with blocked urinary catheter x 8 hours.   Dysuria   This is a recurrent problem. The current episode started 6 to 12 hours ago. Episode frequency: consistently with catheter in place. The problem has been gradually worsening. The quality of the pain is described as aching and stabbing. The pain is moderate. There has been no fever. Pertinent negatives include no vomiting. He has tried nothing for the symptoms. His past medical history is significant for urological procedure and catheterization.    Past Medical History:  Diagnosis Date  . Cancer ([place])   . Carcinoma in situ of prostate   . Diabetes mellitus without complication ([place])   . History of transient ischemic attack (TIA)   . Hyperlipidemia   . Hypertension   . Ischemia   . Obesity   . PAF (paroxysmal atrial fibrillation) ([place])   . Palpitations   . Prostate CA ([place])   . SOB (shortness of breath)     There are no active problems to display for this patient.   Past Surgical History:  Procedure Laterality Date  . PROSTATE SURGERY  10/2016   Seeds implanted        Home Medications    Prior to Admission medications   Medication Sig Start Date End Date Taking? Authorizing Provider  acetaminophen (TYLENOL) 500 MG tablet Take 1,000 mg by mouth every 8 (eight) hours as needed for moderate pain.   Yes [provider]  atorvastatin (LIPITOR) 20 MG tablet Take 20 mg by mouth daily.   Yes [provider]  co-enzyme Q-10 30 MG capsule Take 30 mg by mouth daily.   Yes  [provider]  lisinopril-hydrochlorothiazide (PRINZIDE,ZESTORETIC) 20-25 MG tablet Take 1 tablet by mouth daily.   Yes [provider]  metFORMIN (GLUCOPHAGE) 500 MG tablet Take 500 mg by mouth 2 (two) times daily with a meal.   Yes [provider]  metoprolol succinate (TOPROL-XL) 100 MG 24 hr tablet Take 100 mg by mouth daily. Take with or immediately following a meal.   Yes [provider]  Omega-3 Fatty Acids (FISH OIL) 1000 MG CAPS Take 1 capsule by mouth daily.   Yes [provider]  oxyCODONE (OXY IR/ROXICODONE) 5 MG immediate release tablet Take 5 mg by mouth every 4 (four) hours as needed for pain. 11/27/17  Yes [provider]  rivaroxaban (XARELTO) 20 MG TABS tablet Take 1 tablet (20 mg total) by mouth daily with supper. 05/06/17  Yes Wellington Hampshire, MD  tamsulosin (FLOMAX) 0.4 MG CAPS capsule Take 0.4 mg by mouth 2 (two) times daily.    Yes [provider]  traMADol (ULTRAM) 50 MG tablet Take 1 tablet (50 mg total) by mouth every 6 (six) hours as needed. 11/18/17  Yes Recardo Evangelist, PA-C  Turmeric 500 MG TABS Take 1,000 mg by mouth daily.    Yes [provider]  VENTOLIN HFA 108 (90 Base) MCG/ACT inhaler INL 1-2 PFS PO Q 6 H PRN SOB AND WHEEZING 08/14/17  Yes [provider]  belladonna-opium (B&O SUPPRETTES) 16.2-30 MG suppository Place 1 suppository rectally every 8 (eight) hours as needed for pain. 12/16/17   Duffy Bruce, MD  cefdinir (OMNICEF) 300 MG capsule Take 1 capsule (300 mg total) by mouth 2 (two) times daily. Patient not taking: Reported on 12/14/2017 11/18/17   Recardo Evangelist, PA-C  sulfamethoxazole-trimethoprim (BACTRIM DS,SEPTRA DS) 800-160 MG tablet Take 1 tablet by mouth 2 (two) times daily for 7 days. 12/16/17 12/23/17  Duffy Bruce, MD    Family History Family History  Problem Relation Age of Onset  . Breast cancer Mother   . Diabetes Father   . Stroke Father     Social  History Social History   Tobacco Use  . Smoking status: Never Smoker  . Smokeless tobacco: Never Used  Substance Use Topics  . Alcohol use: Not on file  . Drug use: Not on file     Allergies   Patient has no known allergies.   Review of Systems Review of Systems  Gastrointestinal: Positive for abdominal pain. Negative for vomiting.  Genitourinary: Positive for dysuria.  All other systems reviewed and are negative.    Physical Exam Updated Vital Signs BP 135/78   Pulse (!) 112   Resp 20   SpO2 95%   Physical Exam  Constitutional: He appears well-developed and well-nourished. He appears distressed (2/2 pain, pacing back and forth in room).  HENT:  Head: Normocephalic and atraumatic.  Neck: Normal range of motion.  Cardiovascular: Normal rate.  Pulmonary/Chest: Effort normal. No respiratory distress.  Abdominal: He exhibits no distension. There is tenderness in the suprapubic area. There is no rigidity, no rebound and no guarding.  Musculoskeletal: Normal range of motion.  Neurological: He is alert.  Nursing note and vitals reviewed.    ED Treatments / Results  Labs (all labs ordered are listed, but only abnormal results are displayed) Labs Reviewed  CBC WITH DIFFERENTIAL/PLATELET - Abnormal; Notable for the following components:      Result Value   WBC 12.7 (*)    RBC 4.01 (*)    Hemoglobin 12.0 (*)    HCT 36.5 (*)    Neutro Abs 10.0 (*)    Monocytes Absolute 1.3 (*)    All other components within normal limits  BASIC METABOLIC PANEL - Abnormal; Notable for the following components:   Glucose, Bld 175 (*)    BUN 47 (*)    Creatinine, Ser 1.69 (*)    GFR calc non Af Amer 39 (*)    GFR calc Af Amer 46 (*)    All other components within normal limits  PROTIME-INR - Abnormal; Notable for the following components:   Prothrombin Time 15.7 (*)    All other components within normal limits  URINE CULTURE  URINALYSIS, ROUTINE W REFLEX MICROSCOPIC     EKG None  Radiology Ct Image Guided Fluid Drain By Catheter  Result Date: 12/16/2017 INDICATION: 72 year old male with a history of prostate carcinoma, urethral stricture, and current Foley catheter occlusion with urinary retention. EXAM: CT IMAGE GUIDED FLUID DRAIN BY CATHETER COMPARISON:  None. MEDICATIONS: 400 mg Cipro; The antibiotic was administered in an appropriate time frame prior to skin puncture. ANESTHESIA/SEDATION: Fentanyl 150 mcg IV; Versed 3.0 mg IV Moderate Sedation Time:  32 minutes The patient was continuously monitored during the procedure by the interventional radiology nurse under my direct supervision. CONTRAST:  None FLUOROSCOPY TIME:  CT COMPLICATIONS: None PROCEDURE: Informed written consent was obtained from the patient after a thorough discussion of  the procedural risks, benefits and alternatives. All questions were addressed. Maximal Sterile Barrier Technique was utilized including caps, mask, sterile gowns, sterile gloves, sterile drape, hand hygiene and skin antiseptic. A timeout was performed prior to the initiation of the procedure. Patient positioned supine position on the CT gantry table. CT scout CT performed for planning purposes. The patient is then prepped and draped in the usual sterile fashion. 1% lidocaine was used for local anesthesia. Using modified Seldinger technique, a 12 French pigtail drainage catheter was placed into the urinary bladder. Drain was sutured at the skin and attached to gravity drainage. Patient tolerated the procedure well and remained hemodynamically stable throughout. No complications were encountered and no significant blood loss. IMPRESSION: Status post placement of suprapubic catheter. Twelve French catheter placed. Signed, Dulcy Fanny. Earleen Newport DO [facility] Radiology PLAN: The patient has follow-up with his [facility] Urology team this Friday. At this time, he is currently on schedule with [facility] for  a 4 week follow-up with moderate sedation and up sizing to a 14 [place] catheter. Electronically Signed   By: Corrie Mckusick D.O.   On: 12/16/2017 18:27    Procedures Procedures (including critical care time)  Medications Ordered in ED Medications  midazolam (VERSED) 2 MG/2ML injection (has no administration in time range)  fentaNYL (SUBLIMAZE) 100 MCG/2ML injection (has no administration in time range)  opium-belladonna (B&O SUPPRETTES) 16.2-60 MG suppository 1 suppository (has no administration in time range)  sodium chloride flush (NS) 0.9 % injection 5 mL (has no administration in time range)  HYDROmorphone (DILAUDID) injection 1 mg (1 mg Intramuscular Given 12/16/17 1240)  HYDROmorphone (DILAUDID) injection 1 mg (1 mg Intravenous Given 12/16/17 1606)  ciprofloxacin (CIPRO) IVPB 400 mg (400 mg Intravenous New Bag/Given 12/16/17 1811)  sodium chloride 0.9 % bolus 1,000 mL (1,000 mLs Intravenous New Bag/Given 12/16/17 1811)  midazolam (VERSED) injection (1 mg Intravenous Given 12/16/17 1733)  fentaNYL (SUBLIMAZE) injection (50 mcg Intravenous Given 12/16/17 1733)     Initial Impression / Assessment and Plan / ED Course  I have reviewed the triage vital signs and the nursing notes.  Pertinent labs & imaging results that were available during my care of the patient were reviewed by me and considered in my medical decision making (see chart for details).     This patient had a prolonged ED stay secondary to >15 phone calls that were made to [facility] consult line (Dr. Otho Ket with on-call urology), our urologist here (Dr. Louis Meckel), patient's sister (Dr. Burt Knack), patient's urologist (Dr. Francesca Jewett) and our interventional Radiologist (Dr. Jacqualyn Posey) regarding relieving this patient's obstructed catheter after I could not do it. In brief, patient and his urologist were very hesitant and basically refused to let me and try and remove and replace as per our urologist recommendation. Ended up going to IR  to get suprapubic catheter placed which was scheduled for Friday anyway. Care transferred to Dr. Ellender Hose pending reevaluation after catheter placed for likely discharge.   Final Clinical Impressions(s) / ED Diagnoses   Final diagnoses:  Urinary retention  Acute cystitis without hematuria  AKI (acute kidney injury) [facility])    ED Discharge Orders        Ordered    sulfamethoxazole-trimethoprim (BACTRIM DS,SEPTRA DS) 800-160 MG tablet  2 times daily     12/16/17 1906    belladonna-opium (B&O SUPPRETTES) 16.2-30 MG suppository  Every 8 hours PRN     12/16/17 1906       Keylani Perlstein, [place],  MD 12/16/17 1918

## 2017-12-16 NOTE — Sedation Documentation (Signed)
Vital signs stable. Patient rates pain 5/10 on 1-10 scale

## 2017-12-16 NOTE — ED Notes (Signed)
Bladder scan for 356 ml.

## 2017-12-16 NOTE — Consult Note (Signed)
I have been asked to see the patient by Dr. Merrily Pew, for evaluation and management of occluded foley catheter.  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  The patient has a history of prostate cancer and underwent external beam radiation therapy with a brachytherapy boost partly one year ago.  He subsequently developed a radiation-induced bulbar urethral stricture.  He noted worsening urinary tract symptoms over the last several months.  He was seen in our office and there was one failed attempt at placing a Foley catheter.  He subsequently went back to his primary urologist in [facility] and ultimately in the operating room by 12 [place] Foley catheter was able to be passed beyond the patient stricture.  2 weeks later the patient developed an occluded catheter.  It has required irrigation on one another trip to the emergency room several days prior.  However, today they were unable to occlude the catheter.  The patient has not voided since 2 AM.  He is having severe suprapubic pain.  The bladder scan obtained at the time of presentation demonstrated 380 cc of urine.  Review of systems: A 12 point comprehensive review of systems was obtained and is negative unless otherwise stated in the history of present illness.  There are no active problems to display for this patient.   No current facility-administered medications on file prior to encounter.    Current Outpatient Medications on File Prior to Encounter  Medication Sig Dispense Refill  . acetaminophen (TYLENOL) 500 MG tablet Take 1,000 mg by mouth every 8 (eight) hours as needed for moderate pain.    Marland Kitchen atorvastatin (LIPITOR) 20 MG tablet Take 20 mg by mouth daily.    Marland Kitchen co-enzyme Q-10 30 MG capsule Take 30 mg by mouth daily.    Marland Kitchen lisinopril-hydrochlorothiazide (PRINZIDE,ZESTORETIC) 20-25 MG tablet Take 1  tablet by mouth daily.    . metFORMIN (GLUCOPHAGE) 500 MG tablet Take 500 mg by mouth 2 (two) times daily with a meal.    . metoprolol succinate (TOPROL-XL) 100 MG 24 hr tablet Take 100 mg by mouth daily. Take with or immediately following a meal.    . Omega-3 Fatty Acids (FISH OIL) 1000 MG CAPS Take 1 capsule by mouth daily.    Marland Kitchen oxyCODONE (OXY IR/ROXICODONE) 5 MG immediate release tablet Take 5 mg by mouth every 4 (four) hours as needed for pain.  0  . rivaroxaban (XARELTO) 20 MG TABS tablet Take 1 tablet (20 mg total) by mouth daily with supper. 30 tablet 5  . tamsulosin (FLOMAX) 0.4 MG CAPS capsule Take 0.4 mg by mouth 2 (two) times daily.     . traMADol (ULTRAM) 50 MG tablet Take 1 tablet (50 mg total) by mouth every 6 (six) hours as needed. 15 tablet 0  . Turmeric 500 MG TABS Take 1,000 mg by mouth daily.     . VENTOLIN HFA 108 (90 Base) MCG/ACT inhaler INL 1-2 PFS PO Q 6 H PRN SOB AND WHEEZING  1  . cefdinir (OMNICEF) 300 MG capsule Take 1 capsule (300 mg total) by mouth 2 (two) times daily. (Patient not taking: Reported on 12/14/2017) 14 capsule 0    Past Medical History:  Diagnosis Date  . Cancer ([place])   . Carcinoma in situ of prostate   . Diabetes mellitus without complication ([place])   . History of transient ischemic attack (TIA)   .  Hyperlipidemia   . Hypertension   . Ischemia   . Obesity   . PAF (paroxysmal atrial fibrillation) ([place])   . Palpitations   . Prostate CA ([place])   . SOB (shortness of breath)     Past Surgical History:  Procedure Laterality Date  . PROSTATE SURGERY  10/2016   Seeds implanted    Social History   Tobacco Use  . Smoking status: Never Smoker  . Smokeless tobacco: Never Used  Substance Use Topics  . Alcohol use: Not on file  . Drug use: Not on file    Family History  Problem Relation Age of Onset  . Breast cancer Mother   . Diabetes Father   . Stroke Father     PE: Vitals:   12/16/17 1735 12/16/17 1742 12/16/17 1754 12/16/17 1949   BP: 115/86 126/75 135/78 (!) 146/85  Pulse: (!) 105 (!) 103 (!) 112 100  Resp: 13 17 20 16   SpO2: 94% 98% 95% 96%   Patient appears to be in no acute distress  patient is alert and oriented x3 Atraumatic normocephalic head No cervical or supraclavicular lymphadenopathy appreciated No increased work of breathing, no audible wheezes/rhonchi Regular sinus rhythm/rate Abdomen is soft, nontender, nondistended, no CVA Which is tender.  The Foley catheter is draining no urine with only irrigated within the catheter bag. Lower extremities are symmetric without appreciable edema Grossly neurologically intact No identifiable skin lesions  Recent Labs    12/16/17 1604  WBC 12.7*  HGB 12.0*  HCT 36.5*   Recent Labs    12/16/17 1604  NA 141  K 4.7  CL 104  CO2 22  GLUCOSE 175*  BUN 47*  CREATININE 1.69*  CALCIUM 9.7   Recent Labs    12/16/17 1604  INR 1.26   No results for input(s): LABURIN in the last 72 hours. No results found for this or any previous visit.  Imaging: none  Imp/ Recommendations: The patient has a radiation-induced bulbar urethral stricture that has been difficult to catheterize.  He now has a 12 [place] Foley catheter and that is completely occluded.  In the emergency department the ER staff was unable to irrigate this catheter at all.   This very difficult situation, and likely is best treated in the short-term with a suprapubic catheter.  As such, I recommend the patient proceed to interventional radiology for stat placement of a SP tube.  Once the suprapubic tube is in place, his Foley catheter can be removed.  The patient has scheduled follow-up at [facility] in 3 days.  He should keep this follow-up for additional management options and strategy.  Louis Meckel W

## 2017-12-16 NOTE — Sedation Documentation (Signed)
Vital signs stable. 

## 2017-12-16 NOTE — ED Triage Notes (Signed)
Per EMS: Pt reports he is having an obstruction and has not been able to properly drain into his catheter since 2am this morning.  Pt reports pressure in his bladder, pt has a hx of prostate cancer.  Pt's wife reported to EMS that he is having surgery this Friday.

## 2017-12-16 NOTE — Sedation Documentation (Signed)
Report given to [facility] ED RN

## 2017-12-16 NOTE — Discharge Instructions (Signed)
Dr. Chrissie Noa, it was a pleasure taking care of you today.  I called Dr. Francesca Jewett after your cath placement and he recommends Bactrim, which I've prescribed.  I've also prescribed belladonna suppositories to help with bladder spasm.  Please return as needed, or call with any questions.

## 2017-12-16 NOTE — ED Notes (Signed)
Patient transported to IR and will return back to the room.

## 2017-12-16 NOTE — ED Notes (Signed)
Bladder scan for 697 ml .

## 2017-12-16 NOTE — ED Notes (Signed)
Per IR reported pt had suprapubic catheter place and can be discharged at 7 pm.  Pt also refused for existing foley catheter to be removed. Patient was given versed 3 mg and fentanyl 100 mcg during the procedure.

## 2017-12-16 NOTE — Sedation Documentation (Signed)
Pt refused removal of foley catheter

## 2017-12-16 NOTE — Sedation Documentation (Signed)
Pt tolerated procedure very well. Will remove foley catheter per Louis Meckel

## 2017-12-16 NOTE — ED Notes (Signed)
Patient agree to removed old foley cath. Foley cath removed at this time.

## 2017-12-17 MED FILL — SULFAMETHOXAZOLE-TMP DS TAB: 800-160 | 7 days supply | Qty: 14 | Fill #0

## 2017-12-18 LAB — URINE CULTURE: Culture: 60000 — AB

## 2017-12-25 ENCOUNTER — Telehealth: Payer: Self-pay | Admitting: Student

## 2017-12-25 ENCOUNTER — Other Ambulatory Visit (HOSPITAL_COMMUNITY): Payer: Self-pay | Admitting: Interventional Radiology

## 2017-12-25 DIAGNOSIS — N35011 Post-traumatic bulbous urethral stricture: Secondary | ICD-10-CM | POA: Diagnosis not present

## 2017-12-25 DIAGNOSIS — R339 Retention of urine, unspecified: Secondary | ICD-10-CM

## 2017-12-25 NOTE — Telephone Encounter (Signed)
PA received message that patient called with questions after his SP catheter placement on 12/16/17.  Called patient's number [phone]) with no answer.   Brynda Greathouse, MS RD PA-C 4:16 PM

## 2018-01-12 ENCOUNTER — Other Ambulatory Visit: Payer: Self-pay | Admitting: Radiology

## 2018-01-13 ENCOUNTER — Encounter (HOSPITAL_COMMUNITY): Payer: Self-pay

## 2018-01-13 ENCOUNTER — Ambulatory Visit (HOSPITAL_COMMUNITY)
Admission: RE | Admit: 2018-01-13 | Discharge: 2018-01-13 | Disposition: A | Discharge status: Home / Self Care | Payer: Medicare PPO | Source: Ambulatory Visit | Attending: Interventional Radiology | Admitting: Interventional Radiology

## 2018-01-13 ENCOUNTER — Inpatient Hospital Stay (HOSPITAL_COMMUNITY): Admit: 2018-01-13 | Payer: Medicare PPO

## 2018-01-13 DIAGNOSIS — Z435 Encounter for attention to cystostomy: Secondary | ICD-10-CM | POA: Insufficient documentation

## 2018-01-13 DIAGNOSIS — Z8673 Personal history of transient ischemic attack (TIA), and cerebral infarction without residual deficits: Secondary | ICD-10-CM | POA: Diagnosis not present

## 2018-01-13 DIAGNOSIS — N35919 Unspecified urethral stricture, male, unspecified site: Secondary | ICD-10-CM | POA: Diagnosis not present

## 2018-01-13 DIAGNOSIS — I480 Paroxysmal atrial fibrillation: Secondary | ICD-10-CM | POA: Insufficient documentation

## 2018-01-13 DIAGNOSIS — Z8546 Personal history of malignant neoplasm of prostate: Secondary | ICD-10-CM | POA: Diagnosis not present

## 2018-01-13 DIAGNOSIS — I10 Essential (primary) hypertension: Secondary | ICD-10-CM | POA: Insufficient documentation

## 2018-01-13 DIAGNOSIS — Z7984 Long term (current) use of oral hypoglycemic drugs: Secondary | ICD-10-CM | POA: Insufficient documentation

## 2018-01-13 DIAGNOSIS — Z923 Personal history of irradiation: Secondary | ICD-10-CM | POA: Insufficient documentation

## 2018-01-13 DIAGNOSIS — E785 Hyperlipidemia, unspecified: Secondary | ICD-10-CM | POA: Insufficient documentation

## 2018-01-13 DIAGNOSIS — E119 Type 2 diabetes mellitus without complications: Secondary | ICD-10-CM | POA: Diagnosis not present

## 2018-01-13 DIAGNOSIS — E669 Obesity, unspecified: Secondary | ICD-10-CM | POA: Diagnosis not present

## 2018-01-13 DIAGNOSIS — C61 Malignant neoplasm of prostate: Secondary | ICD-10-CM | POA: Diagnosis not present

## 2018-01-13 DIAGNOSIS — R339 Retention of urine, unspecified: Secondary | ICD-10-CM | POA: Insufficient documentation

## 2018-01-13 DIAGNOSIS — Z79899 Other long term (current) drug therapy: Secondary | ICD-10-CM | POA: Insufficient documentation

## 2018-01-13 DIAGNOSIS — Z7901 Long term (current) use of anticoagulants: Secondary | ICD-10-CM | POA: Diagnosis not present

## 2018-01-13 HISTORY — PX: IR CATHETER TUBE CHANGE: IMG717

## 2018-01-13 LAB — CBC WITH DIFFERENTIAL/PLATELET
BASOS ABS: 0 10*3/uL (ref 0.0–0.1)
BASOS PCT: 0 %
EOS ABS: 0.3 10*3/uL (ref 0.0–0.7)
Eosinophils Relative: 2 %
HCT: 35.7 % — ABNORMAL LOW (ref 39.0–52.0)
HEMOGLOBIN: 11.5 g/dL — AB (ref 13.0–17.0)
Lymphocytes Relative: 13 %
Lymphs Abs: 1.5 10*3/uL (ref 0.7–4.0)
MCH: 30 pg (ref 26.0–34.0)
MCHC: 32.2 g/dL (ref 30.0–36.0)
MCV: 93.2 fL (ref 78.0–100.0)
MONO ABS: 1.1 10*3/uL — AB (ref 0.1–1.0)
Monocytes Relative: 9 %
NEUTROS PCT: 76 %
Neutro Abs: 9.2 10*3/uL — ABNORMAL HIGH (ref 1.7–7.7)
Platelets: 279 10*3/uL (ref 150–400)
RBC: 3.83 MIL/uL — ABNORMAL LOW (ref 4.22–5.81)
RDW: 13.9 % (ref 11.5–15.5)
WBC: 12.2 10*3/uL — ABNORMAL HIGH (ref 4.0–10.5)

## 2018-01-13 LAB — BASIC METABOLIC PANEL
ANION GAP: 12 (ref 5–15)
BUN: 18 mg/dL (ref 6–20)
CALCIUM: 9.2 mg/dL (ref 8.9–10.3)
CO2: 24 mmol/L (ref 22–32)
CREATININE: 1.03 mg/dL (ref 0.61–1.24)
Chloride: 104 mmol/L (ref 101–111)
Glucose, Bld: 117 mg/dL — ABNORMAL HIGH (ref 65–99)
Potassium: 4.1 mmol/L (ref 3.5–5.1)
Sodium: 140 mmol/L (ref 135–145)

## 2018-01-13 LAB — GLUCOSE, CAPILLARY: GLUCOSE-CAPILLARY: 99 mg/dL (ref 65–99)

## 2018-01-13 LAB — PROTIME-INR
INR: 1.01
PROTHROMBIN TIME: 13.2 s (ref 11.4–15.2)

## 2018-01-13 MED ORDER — FENTANYL CITRATE (PF) 100 MCG/2ML IJ SOLN
INTRAMUSCULAR | Status: AC
  Filled 2018-01-13: qty 2

## 2018-01-13 MED ORDER — LIDOCAINE HCL (PF) 2 % IJ SOLN
INTRAMUSCULAR | Status: AC | PRN
  Administered 2018-01-13: 10 mL

## 2018-01-13 MED ORDER — CIPROFLOXACIN IN D5W 400 MG/200ML IV SOLN: 400.0000 mg | INTRAVENOUS | Status: DC

## 2018-01-13 MED ORDER — IOPAMIDOL (ISOVUE-300) INJECTION 61%
INTRAVENOUS | Status: AC
  Administered 2018-01-13: 10 mL
  Filled 2018-01-13: qty 50

## 2018-01-13 MED ORDER — MIDAZOLAM HCL 2 MG/2ML IJ SOLN
INTRAMUSCULAR | Status: AC | PRN
  Administered 2018-01-13: 1 mg via INTRAVENOUS

## 2018-01-13 MED ORDER — CEFAZOLIN SODIUM-DEXTROSE 2-4 GM/100ML-% IV SOLN
2.0000 g | Freq: Once | INTRAVENOUS | Status: AC
  Administered 2018-01-13: 2 g via INTRAVENOUS

## 2018-01-13 MED ORDER — MIDAZOLAM HCL 2 MG/2ML IJ SOLN
INTRAMUSCULAR | Status: AC
  Filled 2018-01-13: qty 2

## 2018-01-13 MED ORDER — CEFAZOLIN SODIUM-DEXTROSE 2-4 GM/100ML-% IV SOLN
INTRAVENOUS | Status: AC
  Filled 2018-01-13: qty 100

## 2018-01-13 MED ORDER — FENTANYL CITRATE (PF) 100 MCG/2ML IJ SOLN
INTRAMUSCULAR | Status: AC | PRN
  Administered 2018-01-13: 25 ug via INTRAVENOUS
  Administered 2018-01-13: 50 ug via INTRAVENOUS

## 2018-01-13 MED ORDER — IOPAMIDOL (ISOVUE-300) INJECTION 61%
10.0000 mL | Freq: Once | INTRAVENOUS | Status: AC | PRN
  Administered 2018-01-13: 10 mL

## 2018-01-13 MED ORDER — SODIUM CHLORIDE 0.9 % IV SOLN
INTRAVENOUS | Status: DC
  Administered 2018-01-13: 13:00:00 via INTRAVENOUS

## 2018-01-13 MED ORDER — LIDOCAINE VISCOUS 2 % MT SOLN
OROMUCOSAL | Status: AC | PRN
  Administered 2018-01-13: 15 mL via OROMUCOSAL

## 2018-01-13 MED ORDER — LIDOCAINE HCL (PF) 2 % IJ SOLN
INTRAMUSCULAR | Status: AC
  Filled 2018-01-13: qty 10

## 2018-01-13 MED ORDER — LIDOCAINE VISCOUS 2 % MT SOLN
OROMUCOSAL | Status: AC
  Filled 2018-01-13: qty 15

## 2018-01-13 MED ORDER — LIDOCAINE HCL 1 % IJ SOLN
INTRAMUSCULAR | Status: AC | PRN
  Administered 2018-01-13: 10 mL

## 2018-01-13 NOTE — Procedures (Signed)
Interventional Radiology Procedure Note  Procedure: Exchange and upsizing of suprapubic catheter  Complications: None  Estimated Blood Loss: None  Findings: 12 Fr SP tube upsized to 16 Fr over guidewire.  Connected to gravity bag.  Venetia Night. Kathlene Cote, M.D Pager:  [phone]

## 2018-01-13 NOTE — H&P (Signed)
Referring Physician(s): Borden,L/Herrick,B  Supervising Physician: Aletta Edouard  Patient Status:  [facility] OP  Chief Complaint:  Urethral stricture, prostate cancer  Subjective: Patient familiar to IR service from recent suprapubic catheter placement on 12/16/17.  He has a history of prostate cancer with prior radiation therapy and seed implantation, diabetes, paroxysmal atrial fibrillation on Xarelto, prior TIA, hypertension, hyperlipidemia.  Post brachytherapy the patient developed radiation-induced bulbar urethral stricture which eventually necessitated Foley catheter placement.  He later developed an occluded Foley catheter and subsequently underwent suprapubic catheter as noted above.  He presents today for suprapubic catheter exchange and upsizing.  He currently denies fever, headache, chest pain, dyspnea, cough, back pain, nausea, vomiting or bleeding.  He does have some mild discomfort at the suprapubic tube insertion site. Past Medical History:  Diagnosis Date  . Cancer ([place])   . Carcinoma in situ of prostate   . Diabetes mellitus without complication ([place])   . History of transient ischemic attack (TIA)   . Hyperlipidemia   . Hypertension   . Ischemia   . Obesity   . PAF (paroxysmal atrial fibrillation) ([place])   . Palpitations   . Prostate CA ([place])   . SOB (shortness of breath)    Past Surgical History:  Procedure Laterality Date  . PROSTATE SURGERY  10/2016   Seeds implanted     Allergies: Patient has no known allergies.  Medications: Prior to Admission medications   Medication Sig Start Date End Date Taking? Authorizing Provider  acetaminophen (TYLENOL) 500 MG tablet Take 1,000 mg by mouth every 8 (eight) hours as needed for moderate pain.   Yes [provider]  atorvastatin (LIPITOR) 20 MG tablet Take 20 mg by mouth daily.   Yes [provider]  doxycycline (VIBRAMYCIN) 100 MG capsule Take 100 mg by mouth daily.   Yes [provider]    lisinopril-hydrochlorothiazide (PRINZIDE,ZESTORETIC) 20-25 MG tablet Take 1 tablet by mouth daily.   Yes [provider]  metFORMIN (GLUCOPHAGE) 500 MG tablet Take 500 mg by mouth 2 (two) times daily with a meal.   Yes [provider]  metoprolol succinate (TOPROL-XL) 100 MG 24 hr tablet Take 100 mg by mouth daily. Take with or immediately following a meal.   Yes [provider]  belladonna-opium (B&O SUPPRETTES) 16.2-30 MG suppository Place 1 suppository rectally every 8 (eight) hours as needed for pain. 12/16/17   Duffy Bruce, MD  cefdinir (OMNICEF) 300 MG capsule Take 1 capsule (300 mg total) by mouth 2 (two) times daily. Patient not taking: Reported on 12/14/2017 11/18/17   Recardo Evangelist, PA-C  co-enzyme Q-10 30 MG capsule Take 30 mg by mouth daily.    [provider]  Omega-3 Fatty Acids (FISH OIL) 1000 MG CAPS Take 1 capsule by mouth daily.    [provider]  oxyCODONE (OXY IR/ROXICODONE) 5 MG immediate release tablet Take 5 mg by mouth every 4 (four) hours as needed for pain. 11/27/17   [provider]  rivaroxaban (XARELTO) 20 MG TABS tablet Take 1 tablet (20 mg total) by mouth daily with supper. 05/06/17   Wellington Hampshire, MD  tamsulosin (FLOMAX) 0.4 MG CAPS capsule Take 0.4 mg by mouth 2 (two) times daily.     [provider]  traMADol (ULTRAM) 50 MG tablet Take 1 tablet (50 mg total) by mouth every 6 (six) hours as needed. 11/18/17   Recardo Evangelist, PA-C  Turmeric 500 MG TABS Take 1,000 mg by mouth daily.  [provider]  VENTOLIN HFA 108 (90 Base) MCG/ACT inhaler INL 1-2 PFS PO Q 6 H PRN SOB AND WHEEZING 08/14/17   [provider]     Vital Signs: BP 138/80 (BP Location: Right Arm)   Pulse 72   Temp 97.9 F (36.6 C) (Oral)   Resp 16   SpO2 95%   Physical Exam awake, alert.  Chest clear to auscultation bilaterally.  Heart with regular rate and rhythm.  Abdomen soft, positive bowel  sounds, clean, intact suprapubic catheter with mild tenderness at insertion site.  Yellow urine in bag.  No lower extremity edema  Imaging: No results found.  Labs:  CBC: Recent Labs    12/16/17 1604 01/13/18 1303  WBC 12.7* 12.2*  HGB 12.0* 11.5*  HCT 36.5* 35.7*  PLT 307 279    COAGS: Recent Labs    12/16/17 1604 01/13/18 1303  INR 1.26 1.01    BMP: Recent Labs    12/16/17 1604 01/13/18 1303  NA 141 140  K 4.7 4.1  CL 104 104  CO2 22 24  GLUCOSE 175* 117*  BUN 47* 18  CALCIUM 9.7 9.2  CREATININE 1.69* 1.03  GFRNONAA 39* >60  GFRAA 46* >60    LIVER FUNCTION TESTS: No results for input(s): BILITOT, AST, ALT, ALKPHOS, PROT, ALBUMIN in the last 8760 hours.  Assessment and Plan: Pt with history of prostate cancer with prior radiation therapy and seed implantation, diabetes, paroxysmal atrial fibrillation on Xarelto, prior TIA, hypertension, hyperlipidemia.  Post brachytherapy the patient developed radiation-induced bulbar urethral stricture which eventually necessitated Foley catheter placement.  He later developed an occluded Foley catheter and subsequently underwent suprapubic catheter placement at [facility] on 12/16/17.   Urine culture from 12/18/17 yielded enterococcus.  He presents again today for suprapubic catheter exchange and upsizing.  Details/risks of procedure, including but not limited to, internal bleeding, infection, injury to adjacent structures, discussed with patient and wife their understanding and consent.   Electronically Signed: D. Rowe Robert, PA-C 01/13/2018, 1:47 PM   I spent a total of 20 minutes at the the patient's bedside AND on the patient's hospital floor or unit, greater than 50% of which was counseling/coordinating care for suprapubic catheter exchange/possible upsizing

## 2018-02-13 ENCOUNTER — Ambulatory Visit: Payer: Medicare PPO | Admitting: Neurology

## 2018-02-27 DIAGNOSIS — C61 Malignant neoplasm of prostate: Secondary | ICD-10-CM | POA: Diagnosis not present

## 2018-02-27 DIAGNOSIS — N35011 Post-traumatic bulbous urethral stricture: Secondary | ICD-10-CM | POA: Diagnosis not present

## 2018-03-17 DIAGNOSIS — C61 Malignant neoplasm of prostate: Secondary | ICD-10-CM | POA: Diagnosis not present

## 2018-03-17 DIAGNOSIS — Z923 Personal history of irradiation: Secondary | ICD-10-CM | POA: Diagnosis not present

## 2018-03-17 DIAGNOSIS — I4891 Unspecified atrial fibrillation: Secondary | ICD-10-CM | POA: Diagnosis not present

## 2018-03-17 DIAGNOSIS — Z8673 Personal history of transient ischemic attack (TIA), and cerebral infarction without residual deficits: Secondary | ICD-10-CM | POA: Diagnosis not present

## 2018-03-17 DIAGNOSIS — Z87891 Personal history of nicotine dependence: Secondary | ICD-10-CM | POA: Diagnosis not present

## 2018-03-17 DIAGNOSIS — T83198A Other mechanical complication of other urinary devices and implants, initial encounter: Secondary | ICD-10-CM | POA: Diagnosis not present

## 2018-03-17 DIAGNOSIS — N3289 Other specified disorders of bladder: Secondary | ICD-10-CM | POA: Diagnosis not present

## 2018-03-17 DIAGNOSIS — T83010A Breakdown (mechanical) of cystostomy catheter, initial encounter: Secondary | ICD-10-CM | POA: Diagnosis not present

## 2018-03-17 DIAGNOSIS — I10 Essential (primary) hypertension: Secondary | ICD-10-CM | POA: Diagnosis not present

## 2018-03-17 DIAGNOSIS — R102 Pelvic and perineal pain: Secondary | ICD-10-CM | POA: Diagnosis not present

## 2018-04-16 DIAGNOSIS — R3915 Urgency of urination: Secondary | ICD-10-CM | POA: Diagnosis not present

## 2018-05-11 DIAGNOSIS — E7800 Pure hypercholesterolemia, unspecified: Secondary | ICD-10-CM | POA: Diagnosis not present

## 2018-05-11 DIAGNOSIS — Z7984 Long term (current) use of oral hypoglycemic drugs: Secondary | ICD-10-CM | POA: Diagnosis not present

## 2018-05-11 DIAGNOSIS — Z9359 Other cystostomy status: Secondary | ICD-10-CM | POA: Diagnosis not present

## 2018-05-11 DIAGNOSIS — I10 Essential (primary) hypertension: Secondary | ICD-10-CM | POA: Diagnosis not present

## 2018-05-11 DIAGNOSIS — I4891 Unspecified atrial fibrillation: Secondary | ICD-10-CM | POA: Diagnosis not present

## 2018-05-11 DIAGNOSIS — E1169 Type 2 diabetes mellitus with other specified complication: Secondary | ICD-10-CM | POA: Diagnosis not present

## 2018-05-22 DIAGNOSIS — C61 Malignant neoplasm of prostate: Secondary | ICD-10-CM | POA: Diagnosis not present

## 2018-05-22 DIAGNOSIS — R339 Retention of urine, unspecified: Secondary | ICD-10-CM | POA: Diagnosis not present

## 2018-06-19 DIAGNOSIS — R339 Retention of urine, unspecified: Secondary | ICD-10-CM | POA: Diagnosis not present

## 2018-06-19 DIAGNOSIS — Z466 Encounter for fitting and adjustment of urinary device: Secondary | ICD-10-CM | POA: Diagnosis not present

## 2018-07-31 DIAGNOSIS — H40013 Open angle with borderline findings, low risk, bilateral: Secondary | ICD-10-CM | POA: Diagnosis not present

## 2018-07-31 DIAGNOSIS — H25813 Combined forms of age-related cataract, bilateral: Secondary | ICD-10-CM | POA: Diagnosis not present

## 2018-08-15 IMAGING — XA IR CATHETER TUBE CHANGE
2 series · 8 of 8 positions shown · non-contrast
Comparison: 12/16/2017

INDICATION: Status post suprapubic catheter placement on 12/16/2017 for urinary
retention secondary to urethral stricture. The patient presents for
exchange of the catheter with planned upsizing.

EXAM:
IR CATHETER TUBE CHANGE

[Series 3: fl - angio · 4 of 29 frames shown]
[frame 3/29]
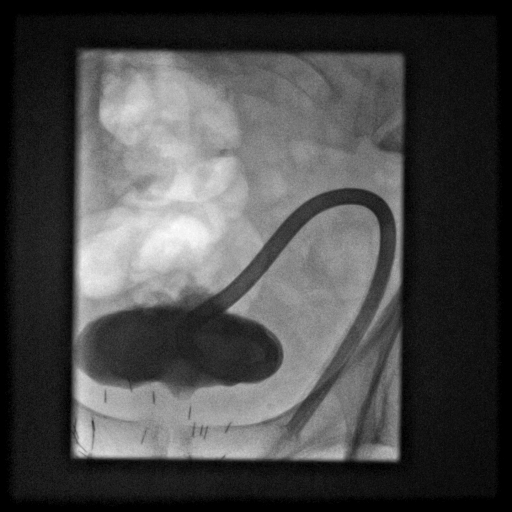
[frame 5/29]
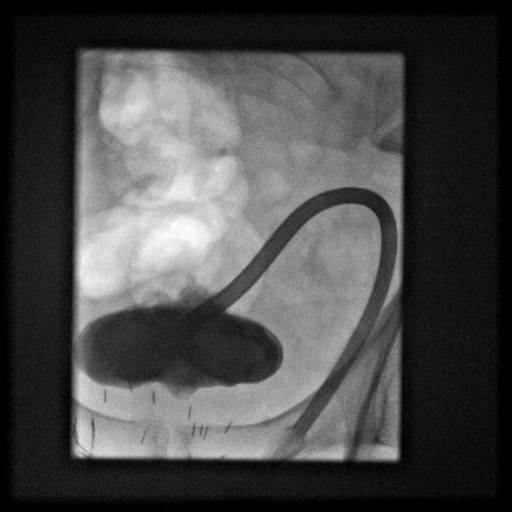
[frame 15/29]
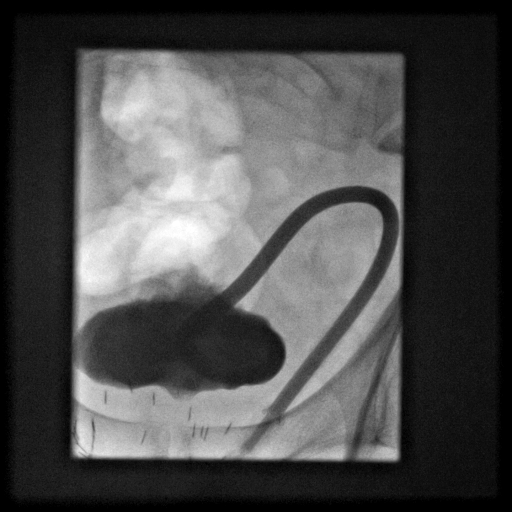
[frame 25/29]
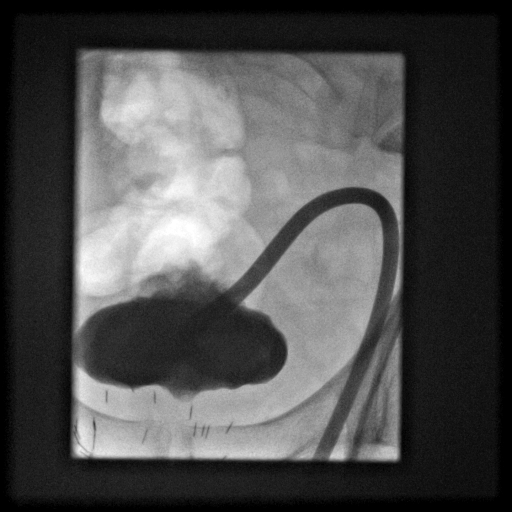

[Series 300: tube placements · 4 of 4 slices shown]
[im 1/4]
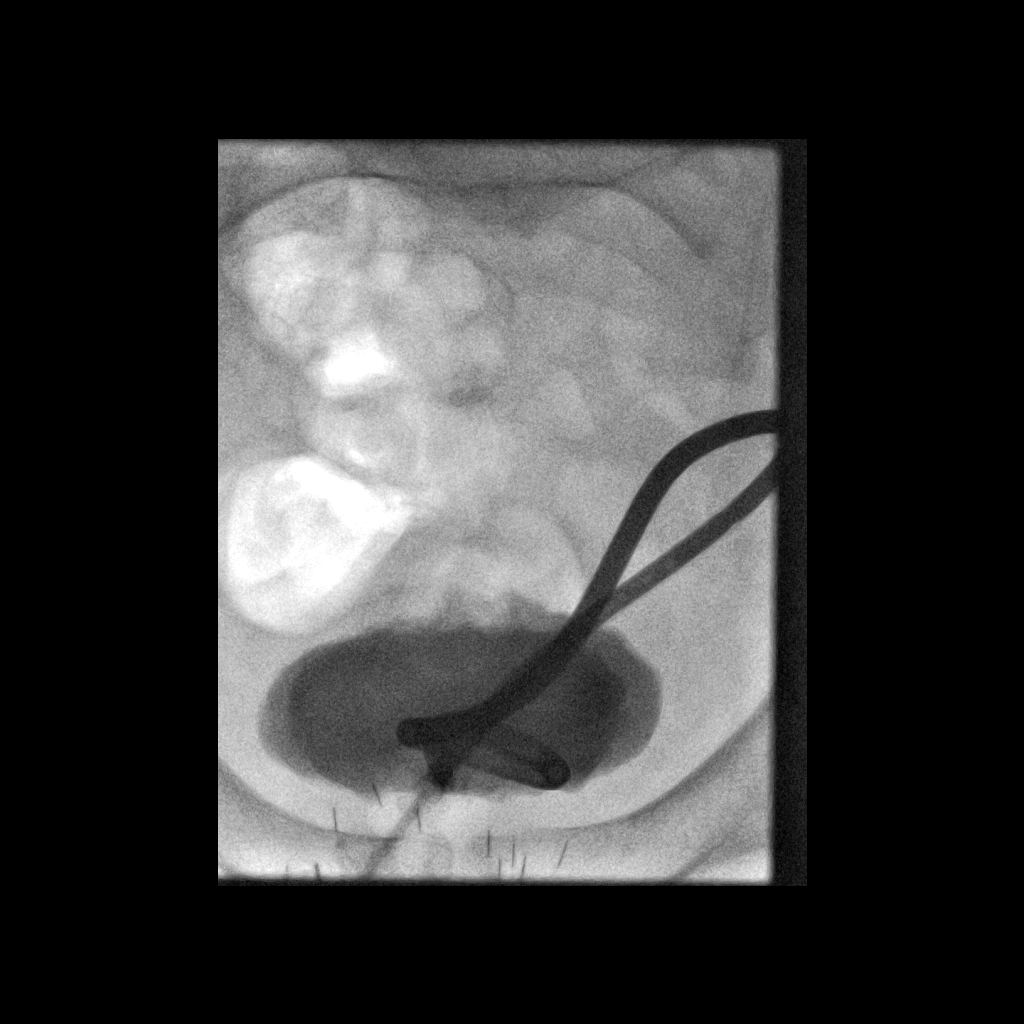
[im 2/4]
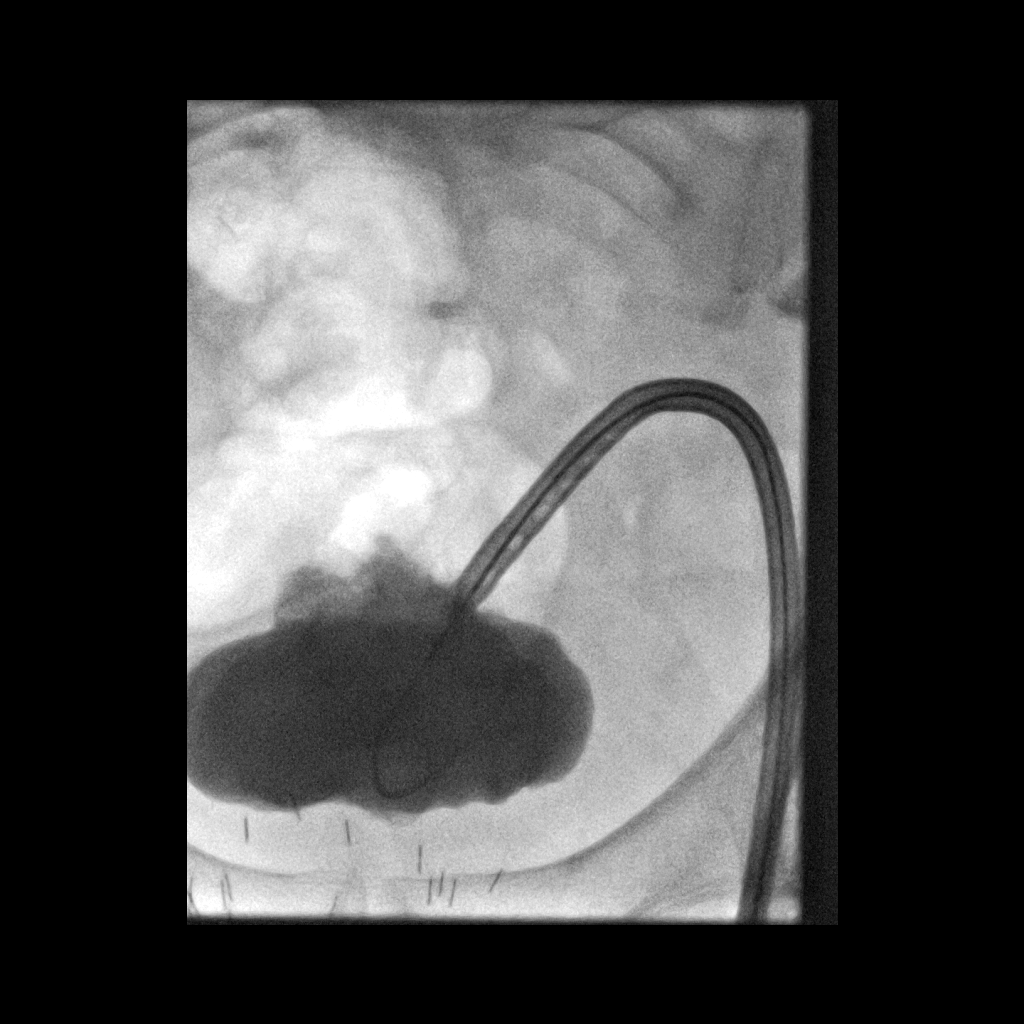
[im 3/4]
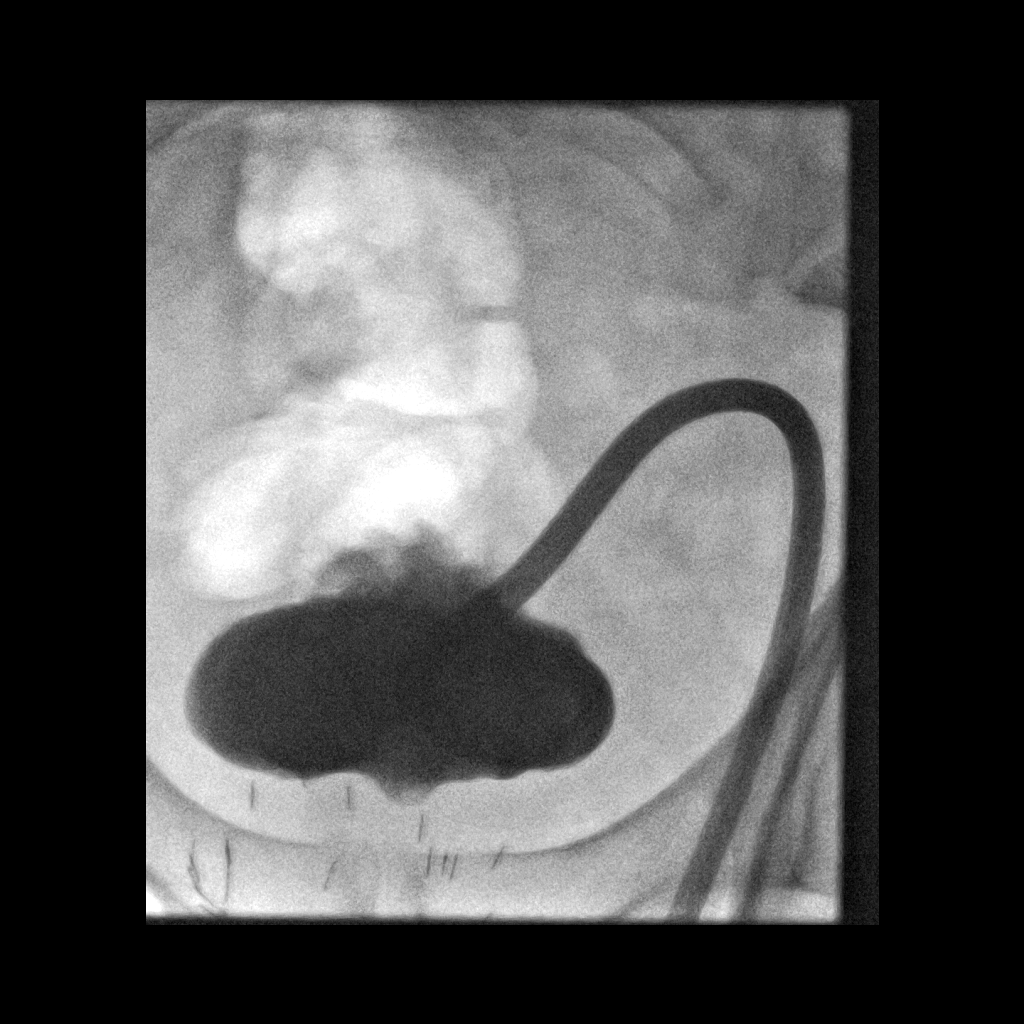
[im 4/4]
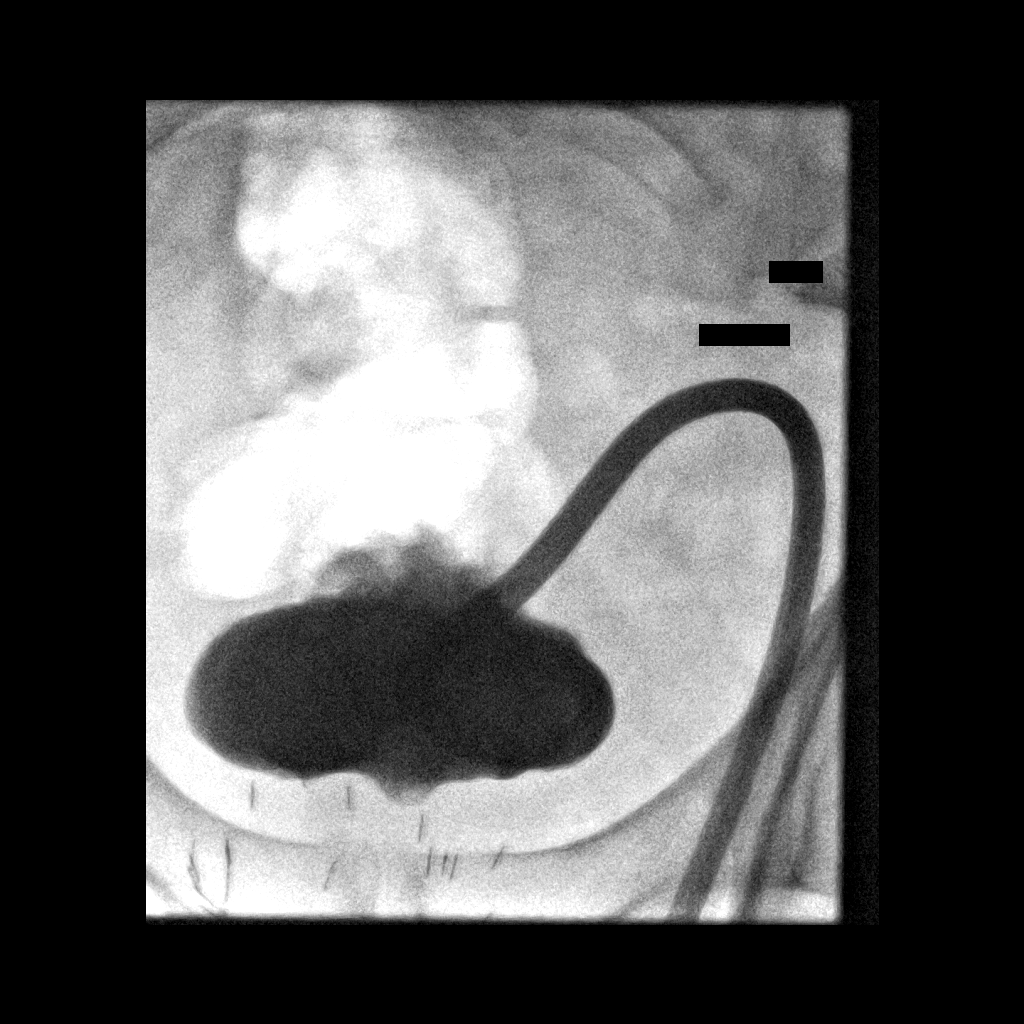

[8 of 8 positions shown; findings below may reference images not displayed]

MEDICATIONS:
2 g IV Ancef; antibiotic was administered in an appropriate time
frame prior to the procedure.

ANESTHESIA/SEDATION:
Fentanyl 100 mcg IV; Versed 2.0 mg IV

Moderate Sedation Time:  14 minutes.

The patient was continuously monitored during the procedure by the
interventional radiology nurse under my direct supervision.

CONTRAST:  20 mL D5B6YF-MGG IOPAMIDOL (D5B6YF-MGG) INJECTION 61% -
administered into the bladder lumen

FLUOROSCOPY TIME:  Fluoroscopy Time: 18 seconds.  25.5 mGy.

COMPLICATIONS:
None immediate.

PROCEDURE:
Informed written consent was obtained from the patient after a
thorough discussion of the procedural risks, benefits and
alternatives. All questions were addressed. Maximal Sterile Barrier
Technique was utilized including caps, mask, sterile gowns, sterile
gloves, sterile drape, hand hygiene and skin antiseptic. A timeout
was performed prior to the initiation of the procedure.

The pre-existing 12 French suprapubic bladder drainage catheter was
injected with contrast material under fluoroscopy. The catheter was
then cut and removed over a guidewire.

The percutaneous tract was dilated with 14 and 16 French dilators. A
new 16 French pigtail drainage catheter was then advanced over the
wire and formed in the bladder lumen. The catheter was injected with
contrast material to confirm position. The new suprapubic catheter
was then connected to a gravity drainage bag. The catheter was
secured at the skin exit site with a Prolene retention suture and
StatLock device.
FINDINGS: Tube size was able to be increased from 12 French to 16 French
without difficulty. The new catheter was formed in the bladder lumen
and is draining urine freely after placement.
IMPRESSION: Exchange and up sizing of suprapubic bladder drainage catheter. The
originally placed 12 French catheter was exchanged for a 16 French
catheter after tract dilatation.

## 2018-08-28 DIAGNOSIS — Z466 Encounter for fitting and adjustment of urinary device: Secondary | ICD-10-CM | POA: Diagnosis not present

## 2019-01-25 DIAGNOSIS — N35011 Post-traumatic bulbous urethral stricture: Secondary | ICD-10-CM | POA: Diagnosis not present

## 2019-02-24 DIAGNOSIS — N35011 Post-traumatic bulbous urethral stricture: Secondary | ICD-10-CM | POA: Diagnosis not present

## 2019-03-03 DIAGNOSIS — Z03818 Encounter for observation for suspected exposure to other biological agents ruled out: Secondary | ICD-10-CM | POA: Diagnosis not present

## 2019-03-03 DIAGNOSIS — Z209 Contact with and (suspected) exposure to unspecified communicable disease: Secondary | ICD-10-CM | POA: Diagnosis not present

## 2019-03-17 DIAGNOSIS — Z209 Contact with and (suspected) exposure to unspecified communicable disease: Secondary | ICD-10-CM | POA: Diagnosis not present

## 2019-03-17 DIAGNOSIS — Z03818 Encounter for observation for suspected exposure to other biological agents ruled out: Secondary | ICD-10-CM | POA: Diagnosis not present

## 2019-03-29 DIAGNOSIS — N35011 Post-traumatic bulbous urethral stricture: Secondary | ICD-10-CM | POA: Diagnosis not present

## 2019-03-31 DIAGNOSIS — Z209 Contact with and (suspected) exposure to unspecified communicable disease: Secondary | ICD-10-CM | POA: Diagnosis not present

## 2019-03-31 DIAGNOSIS — Z03818 Encounter for observation for suspected exposure to other biological agents ruled out: Secondary | ICD-10-CM | POA: Diagnosis not present

## 2019-04-09 DIAGNOSIS — C61 Malignant neoplasm of prostate: Secondary | ICD-10-CM | POA: Diagnosis not present

## 2019-04-12 DIAGNOSIS — R5383 Other fatigue: Secondary | ICD-10-CM | POA: Diagnosis not present

## 2019-04-12 DIAGNOSIS — E538 Deficiency of other specified B group vitamins: Secondary | ICD-10-CM | POA: Diagnosis not present

## 2019-04-12 DIAGNOSIS — L723 Sebaceous cyst: Secondary | ICD-10-CM | POA: Diagnosis not present

## 2019-04-12 DIAGNOSIS — E785 Hyperlipidemia, unspecified: Secondary | ICD-10-CM | POA: Diagnosis not present

## 2019-04-12 DIAGNOSIS — I10 Essential (primary) hypertension: Secondary | ICD-10-CM | POA: Diagnosis not present

## 2019-04-12 DIAGNOSIS — E1165 Type 2 diabetes mellitus with hyperglycemia: Secondary | ICD-10-CM | POA: Diagnosis not present

## 2019-04-12 DIAGNOSIS — I480 Paroxysmal atrial fibrillation: Secondary | ICD-10-CM | POA: Diagnosis not present

## 2019-04-12 DIAGNOSIS — Z6837 Body mass index (BMI) 37.0-37.9, adult: Secondary | ICD-10-CM | POA: Diagnosis not present

## 2019-04-12 DIAGNOSIS — R338 Other retention of urine: Secondary | ICD-10-CM | POA: Diagnosis not present

## 2019-04-16 DIAGNOSIS — C61 Malignant neoplasm of prostate: Secondary | ICD-10-CM | POA: Diagnosis not present

## 2019-04-16 DIAGNOSIS — R3914 Feeling of incomplete bladder emptying: Secondary | ICD-10-CM | POA: Diagnosis not present

## 2019-04-20 DIAGNOSIS — E1165 Type 2 diabetes mellitus with hyperglycemia: Secondary | ICD-10-CM | POA: Diagnosis not present

## 2019-04-20 DIAGNOSIS — Z20828 Contact with and (suspected) exposure to other viral communicable diseases: Secondary | ICD-10-CM | POA: Diagnosis not present

## 2019-04-20 DIAGNOSIS — E538 Deficiency of other specified B group vitamins: Secondary | ICD-10-CM | POA: Diagnosis not present

## 2019-04-20 DIAGNOSIS — R5383 Other fatigue: Secondary | ICD-10-CM | POA: Diagnosis not present

## 2019-04-20 DIAGNOSIS — Z209 Contact with and (suspected) exposure to unspecified communicable disease: Secondary | ICD-10-CM | POA: Diagnosis not present

## 2019-04-20 DIAGNOSIS — E785 Hyperlipidemia, unspecified: Secondary | ICD-10-CM | POA: Diagnosis not present

## 2019-04-20 DIAGNOSIS — Z03818 Encounter for observation for suspected exposure to other biological agents ruled out: Secondary | ICD-10-CM | POA: Diagnosis not present

## 2019-04-20 DIAGNOSIS — R7989 Other specified abnormal findings of blood chemistry: Secondary | ICD-10-CM | POA: Diagnosis not present

## 2019-04-29 DIAGNOSIS — R3914 Feeling of incomplete bladder emptying: Secondary | ICD-10-CM | POA: Diagnosis not present

## 2019-04-29 DIAGNOSIS — N35011 Post-traumatic bulbous urethral stricture: Secondary | ICD-10-CM | POA: Diagnosis not present

## 2019-05-04 ENCOUNTER — Other Ambulatory Visit: Payer: Self-pay

## 2019-05-04 ENCOUNTER — Encounter: Payer: Self-pay | Admitting: Cardiovascular Disease

## 2019-05-04 ENCOUNTER — Ambulatory Visit (INDEPENDENT_AMBULATORY_CARE_PROVIDER_SITE_OTHER): Payer: Medicare PPO | Admitting: Cardiovascular Disease

## 2019-05-04 VITALS — BP 130/79 | HR 68 | Temp 97.6°F | Ht 66.0 in | Wt 231.6 lb

## 2019-05-04 DIAGNOSIS — I10 Essential (primary) hypertension: Secondary | ICD-10-CM

## 2019-05-04 DIAGNOSIS — I480 Paroxysmal atrial fibrillation: Secondary | ICD-10-CM

## 2019-05-04 DIAGNOSIS — E785 Hyperlipidemia, unspecified: Secondary | ICD-10-CM | POA: Diagnosis not present

## 2019-05-04 NOTE — Progress Notes (Signed)
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 He moved from [place]. 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 He has chronic medical conditions that include prostate cancer, essential hypertension, DM, previous TIA, hyperlipidemia and obesity. He had his previous cardiovascular care in [place]. He started having palpitations in 2018 with fatigue. He was initially seen by general cardiology with negative Holter monitor. He had a stress echocardiogram done which showed borderline ST depression in the inferior leads with no convincing evidence of ischemia. EF was reported to be normal. He continued to have palpitations. He was seen by EP (Dr. Adline Potter)  for abnormal Zio monitor. It showed multiple activation due to atrial flutter and fibrillation with a maximum heart rate of 250 bpm. Overall burden was 3%. He improved after switching carvedilol to metoprolol. It was recommended that he proceed with ablation of atrial flutter and atrial fibrillation. During my initial evaluation, the patient was felt to be minimally symptomatic from atrial fibrillation and thus I continued medical therapy.  He denies any chest pain or shortness of breath.  He reports intermittent episodes of palpitation that typically do not last more than 30 seconds.  He does complain of increased fatigue. He was off Xarelto for about a year but this was resumed by his primary care physician recently with no complications.  He reports being off due to complications related to previous radiation therapy for prostate cancer which ultimately required suprapubic catheter placement. He denies symptoms of  sleep apnea other than the need to take frequent naps during the day.    Past Medical History:  Diagnosis Date  . Cancer ([place])   . Carcinoma in situ of prostate   . Diabetes mellitus without complication ([place])   . History of transient ischemic attack (TIA)   . Hyperlipidemia   . Hypertension   . Ischemia   . Obesity   . PAF (paroxysmal atrial fibrillation) ([place])   . Palpitations   . Prostate CA ([place])   . SOB (shortness of breath)     Past Surgical History:  Procedure Laterality Date  . IR CATHETER TUBE CHANGE  01/13/2018  . PROSTATE SURGERY  10/2016   Seeds implanted     Current Outpatient Medications  Medication Sig Dispense Refill  . atorvastatin (LIPITOR) 20 MG tablet Take 20 mg by mouth daily.    Marland Kitchen doxycycline (VIBRAMYCIN) 100 MG capsule Take 100 mg by mouth daily.    Marland Kitchen lisinopril-hydrochlorothiazide (PRINZIDE,ZESTORETIC) 20-25 MG tablet Take 1 tablet by mouth daily.    . metFORMIN (GLUCOPHAGE) 500 MG tablet Take 500 mg by mouth 2 (two) times daily with a meal.    . metoprolol succinate (TOPROL-XL) 100 MG 24 hr tablet Take 100 mg by mouth daily. Take with or immediately following a meal.    . rivaroxaban (XARELTO) 20 MG TABS tablet Take 1 tablet (20 mg total) by mouth daily with supper. 30 tablet 5  . Turmeric 500 MG TABS Take 1,000 mg by mouth daily.     . VENTOLIN HFA 108 (90 Base) MCG/ACT inhaler INL 1-2 PFS PO Q 6 H PRN SOB AND WHEEZING  1  . acetaminophen (TYLENOL) 500 MG tablet Take 1,000 mg by mouth every 8 (eight) hours as needed for moderate pain.    Marland Kitchen belladonna-opium (B&O SUPPRETTES) 16.2-30 MG suppository Place 1 suppository rectally every 8 (eight) hours as needed for pain. (Patient not taking: Reported on 05/04/2019) 20 suppository 0  . cefdinir (OMNICEF) 300 MG capsule Take 1 capsule (300 mg total) by mouth 2 (two) times daily. (Patient not taking: Reported on 12/14/2017) 14 capsule 0  . co-enzyme Q-10 30 MG capsule Take 30 mg by mouth daily.    . Omega-3  Fatty Acids (FISH OIL) 1000 MG CAPS Take 1 capsule by mouth daily.    Marland Kitchen oxyCODONE (OXY IR/ROXICODONE) 5 MG immediate release tablet Take 5 mg by mouth every 4 (four) hours as needed for pain.  0  . tamsulosin (FLOMAX) 0.4 MG CAPS capsule Take 0.4 mg by mouth 2 (two) times daily.     . traMADol (ULTRAM) 50 MG tablet Take 1 tablet (50 mg total) by mouth every 6 (six) hours as needed. (Patient not taking: Reported on 05/04/2019) 15 tablet 0   No current facility-administered medications for this visit.     Allergies:   Patient has no known allergies.    Social History:  The patient  reports that he has never smoked. He has never used smokeless tobacco.   Family History:  The patient's family history includes Breast cancer in his mother; Diabetes in his father; Stroke in his father.    ROS:  Please see the history of present illness.   Otherwise, review of systems are positive for none.   All other systems are reviewed and negative.    PHYSICAL EXAM: VS:  BP 130/79   Pulse 68   Temp 97.6 F (36.4 C)   Ht 5\' 6"  (1.676 m)   Wt 231 lb 9.6 oz (105.1 kg)   SpO2 94%   BMI 37.38 kg/m  , BMI Body mass index is 37.38 kg/m. GEN: Well nourished, well developed, in no acute distress  HEENT: normal  Neck: no JVD, carotid bruits, or masses Cardiac: RRR; no murmurs, rubs, or gallops,no edema  Respiratory:  clear to auscultation bilaterally, normal work of breathing GI: soft, nontender, nondistended, + BS MS: no deformity or atrophy  Skin: warm and dry, no rash Neuro:  Strength and sensation are intact Psych: euthymic mood, full affect   EKG:  EKG is ordered today. The ekg ordered today demonstrates sinus rhythm with first-degree AV block. No significant ST or T wave changes.  Heart rate is 65 bpm.   Recent Labs: No results found for requested labs within last 8760 hours.    Lipid Panel No results found for: CHOL, TRIG, HDL, CHOLHDL, VLDL, LDLCALC, LDLDIRECT    Wt Readings from Last  3 Encounters:  05/04/19 231 lb 9.6 oz (105.1 kg)  05/06/17 247 lb 12.8 oz (112.4 kg)       PAD Screen 05/06/2017  Previous PAD dx? No  Previous surgical procedure? No  Pain with walking? No  Feet/toe relief with dangling? No  Painful, non-healing ulcers? No  Extremities discolored? No      ASSESSMENT AND PLAN:  1.  Paroxysmal atrial fibrillation/flutter: A. fib burden seems to be overall low.  Continue treatment with metoprolol.  I agree with resuming long-term anticoagulation given chads vas score of 5.  I reviewed his most recent labs in March which overall were unremarkable.   Given increased fatigue, I requested an echocardiogram to evaluate  diastolic function and pulmonary pressure.  2. Essential hypertension: Blood pressure is controlled on metoprolol and lisinopril hydrochlorothiazide.  3. Hyperlipidemia: Currently on atorvastatin 20 mg daily with a target LDL of less than 70 given diabetes.  Most recent lipid profile showed an LDL of 70.   Disposition:   FU with me in 12 months  Signed,  Kathlyn [place], MD  05/04/2019 2:23 PM    [place]

## 2019-05-04 NOTE — Patient Instructions (Signed)
Medication Instructions:  Your Physician recommend you continue on your current medication as directed.    If you need a refill on your cardiac medications before your next appointment, please call your pharmacy.   Lab work: None  Testing/Procedures: Your physician has requested that you have an echocardiogram. Echocardiography is a painless test that uses sound waves to create images of your heart. It provides your doctor with information about the size and shape of your heart and how well your heart's chambers and valves are working. This procedure takes approximately one hour. There are no restrictions for this procedure. [place] [employer] [address]   Follow-Up: At [facility], you and your health needs are our priority.  As part of our continuing mission to provide you with exceptional heart care, we have created designated Provider Care Teams.  These Care Teams include your primary Cardiologist (physician) and Advanced Practice Providers (APPs -  Physician Assistants and Nurse Practitioners) who all work together to provide you with the care you need, when you need it. You will need a follow up appointment in 1 years.  Please call our office 2 months in advance to schedule this appointment.  You may see Dr. Fletcher Anon or one of the following Advanced Practice Providers on your designated Care Team:   Kerin Ransom, PA-C Roby Lofts, [place] . Sande Rives, PA-C

## 2019-05-07 DIAGNOSIS — Z03818 Encounter for observation for suspected exposure to other biological agents ruled out: Secondary | ICD-10-CM | POA: Diagnosis not present

## 2019-05-11 ENCOUNTER — Other Ambulatory Visit: Payer: Self-pay

## 2019-05-11 ENCOUNTER — Ambulatory Visit (HOSPITAL_COMMUNITY): Payer: Medicare PPO | Attending: Cardiology

## 2019-05-11 DIAGNOSIS — I480 Paroxysmal atrial fibrillation: Secondary | ICD-10-CM | POA: Insufficient documentation

## 2019-05-19 DIAGNOSIS — D485 Neoplasm of uncertain behavior of skin: Secondary | ICD-10-CM | POA: Diagnosis not present

## 2019-05-21 DIAGNOSIS — Z03818 Encounter for observation for suspected exposure to other biological agents ruled out: Secondary | ICD-10-CM | POA: Diagnosis not present

## 2019-05-24 DIAGNOSIS — R3914 Feeling of incomplete bladder emptying: Secondary | ICD-10-CM | POA: Diagnosis not present

## 2019-06-11 ENCOUNTER — Ambulatory Visit
Admission: RE | Admit: 2019-06-11 | Discharge: 2019-06-11 | Disposition: A | Discharge status: Home / Self Care | Payer: Medicare PPO | Source: Ambulatory Visit | Attending: Internal Medicine | Admitting: Internal Medicine

## 2019-06-11 ENCOUNTER — Other Ambulatory Visit: Payer: Self-pay | Admitting: Internal Medicine

## 2019-06-11 ENCOUNTER — Other Ambulatory Visit: Payer: Self-pay

## 2019-06-11 DIAGNOSIS — R0609 Other forms of dyspnea: Secondary | ICD-10-CM | POA: Diagnosis not present

## 2019-06-11 DIAGNOSIS — R05 Cough: Secondary | ICD-10-CM

## 2019-06-11 DIAGNOSIS — R059 Cough, unspecified: Secondary | ICD-10-CM

## 2019-06-15 DIAGNOSIS — Z960 Presence of urogenital implants: Secondary | ICD-10-CM | POA: Diagnosis not present

## 2019-06-15 DIAGNOSIS — J45901 Unspecified asthma with (acute) exacerbation: Secondary | ICD-10-CM | POA: Diagnosis not present

## 2019-06-15 DIAGNOSIS — N179 Acute kidney failure, unspecified: Secondary | ICD-10-CM | POA: Diagnosis not present

## 2019-06-21 DIAGNOSIS — D485 Neoplasm of uncertain behavior of skin: Secondary | ICD-10-CM | POA: Diagnosis not present

## 2019-10-05 ENCOUNTER — Ambulatory Visit: Payer: Medicare PPO | Admitting: Cardiovascular Disease

## 2019-10-22 ENCOUNTER — Other Ambulatory Visit: Payer: Self-pay | Admitting: Internal Medicine

## 2019-10-22 DIAGNOSIS — R4189 Other symptoms and signs involving cognitive functions and awareness: Secondary | ICD-10-CM

## 2019-10-25 ENCOUNTER — Other Ambulatory Visit: Payer: Self-pay | Admitting: Internal Medicine

## 2019-10-28 ENCOUNTER — Ambulatory Visit
Admission: RE | Admit: 2019-10-28 | Discharge: 2019-10-28 | Disposition: A | Discharge status: Home / Self Care | Payer: Medicare PPO | Source: Ambulatory Visit | Attending: Internal Medicine | Admitting: Internal Medicine

## 2019-10-28 ENCOUNTER — Other Ambulatory Visit: Payer: Medicare PPO

## 2019-10-28 DIAGNOSIS — R4189 Other symptoms and signs involving cognitive functions and awareness: Secondary | ICD-10-CM

## 2019-11-05 ENCOUNTER — Other Ambulatory Visit: Payer: Self-pay

## 2019-11-05 ENCOUNTER — Ambulatory Visit: Payer: Medicare PPO | Attending: Internal Medicine

## 2019-11-05 DIAGNOSIS — Z23 Encounter for immunization: Secondary | ICD-10-CM | POA: Insufficient documentation

## 2019-11-05 NOTE — Progress Notes (Signed)
   Covid-19 Vaccination Clinic  Name:  LILBURN MCCLEES MD    MRN: [id number] DOB: [birth year]  11/05/2019  [name] MD was observed post Covid-19 immunization for 15 minutes without incidence. He was provided with Vaccine Information Sheet and instruction to access the V-Safe system.   Mr. Benedetti MD was instructed to call 911 with any severe reactions post vaccine: Marland Kitchen Difficulty breathing  . Swelling of your face and throat  . A fast heartbeat  . A bad rash all over your body  . Dizziness and weakness    Immunizations Administered    Name Date Dose VIS Date Route   Pfizer COVID-19 Vaccine 11/05/2019  9:20 AM 0.3 mL 09/03/2019 Intramuscular   Manufacturer: [employer]   Lot: [id number]   [place]: [id number]

## 2019-11-30 ENCOUNTER — Ambulatory Visit: Payer: Medicare PPO | Attending: Internal Medicine

## 2019-11-30 DIAGNOSIS — Z23 Encounter for immunization: Secondary | ICD-10-CM | POA: Insufficient documentation

## 2019-11-30 NOTE — Progress Notes (Signed)
   Covid-19 Vaccination Clinic  Name:  Steven MANANSALA MD    MRN: [id number] DOB: 02-22-1946  11/30/2019  [name] MD was observed post Covid-19 immunization for 15 minutes without incident. He was provided with Vaccine Information Sheet and instruction to access the V-Safe system.   [name] MD was instructed to call 911 with any severe reactions post vaccine: Marland Kitchen Difficulty breathing  . Swelling of face and throat  . A fast heartbeat  . A bad rash all over body  . Dizziness and weakness   Immunizations Administered    Name Date Dose VIS Date Route   Pfizer COVID-19 Vaccine 11/30/2019  9:33 AM 0.3 mL 09/03/2019 Intramuscular   Manufacturer: [place]   Lot: [id number]   [place]: [id number]

## 2020-05-29 IMAGING — MR MR HEAD W/O CM
10 series · 48 of 48 positions shown · non-contrast
Comparison: None.

CLINICAL DATA: Memory loss, off balance, blurry vision

EXAM:
MRI HEAD WITHOUT CONTRAST
TECHNIQUE: Multiplanar, multiecho pulse sequences of the brain and surrounding
structures were obtained without intravenous contrast.

[Series 2: T1 · sagittal · 5.0mm · 0.45mm/px · 3 of 21 slices shown]
[im 1/21]
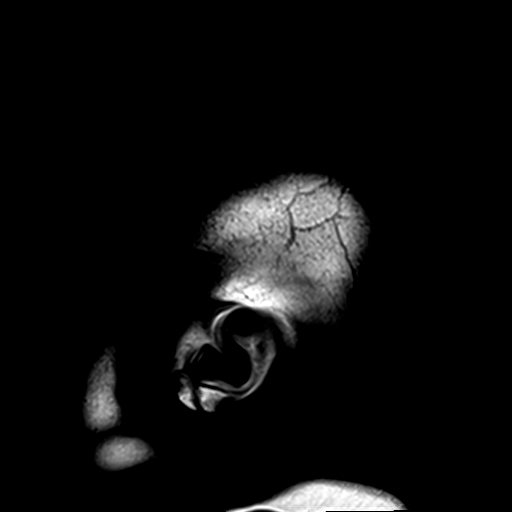
[im 11/21]
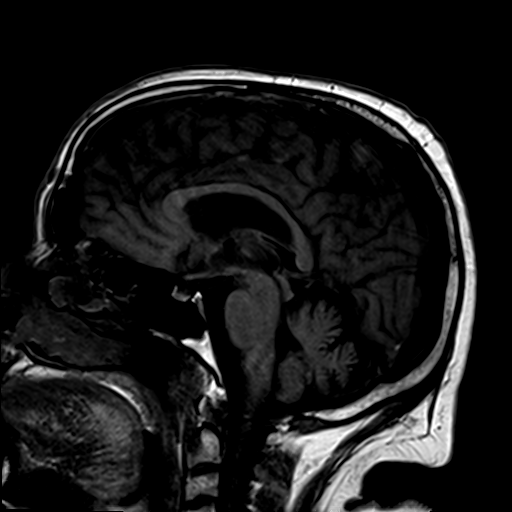
[im 21/21]
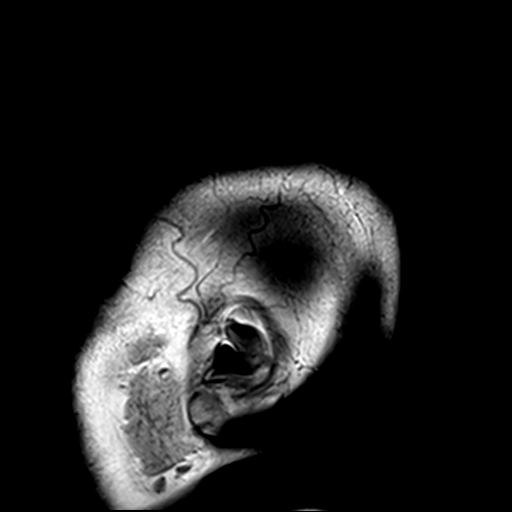

[Series 3: DWI · axial · 3.0mm · 1.80mm/px · z∈[-48,+93]mm · 9 of 100 slices shown (1 of 4)]
[im 1/100]
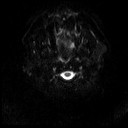
[im 13/100]
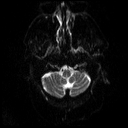
[im 25/100]
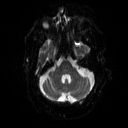
[im 38/100]
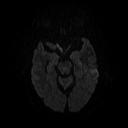
[im 50/100]
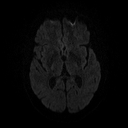
[im 62/100]
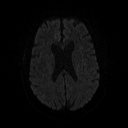
[im 75/100]
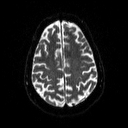
[im 87/100]
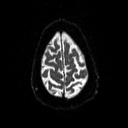
[im 100/100]
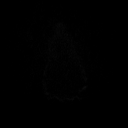

[Series 4: DWI · axial · 3.0mm · 1.80mm/px · z∈[-48,+93]mm · 4 of 49 slices shown (2 of 4)]
[im 1/49]
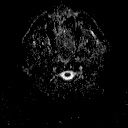
[im 17/49]
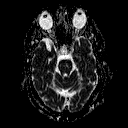
[im 33/49]
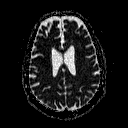
[im 49/49]
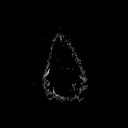

[Series 5: DWI · coronal · 5.0mm · 1.80mm/px · 6 of 66 slices shown (3 of 4)]
[im 1/66]
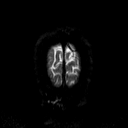
[im 14/66]
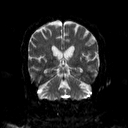
[im 27/66]
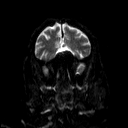
[im 40/66]
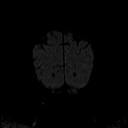
[im 53/66]
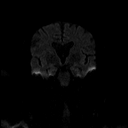
[im 66/66]
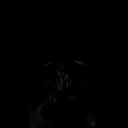

[Series 6: DWI · coronal · 5.0mm · 1.80mm/px · 3 of 34 slices shown (4 of 4)]
[im 1/34]
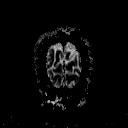
[im 17/34]
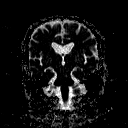
[im 34/34]
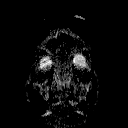

[Series 7: T2 · axial · 5.0mm · 0.51mm/px · z∈[-48,+93]mm · 2 of 22 slices shown (1 of 2)]
[im 1/22]
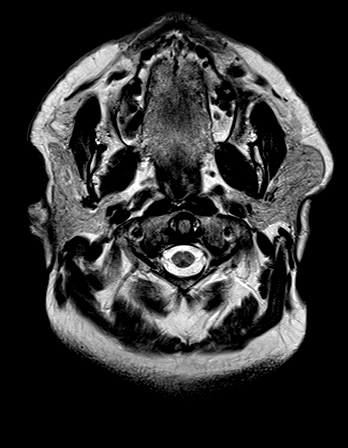
[im 22/22]
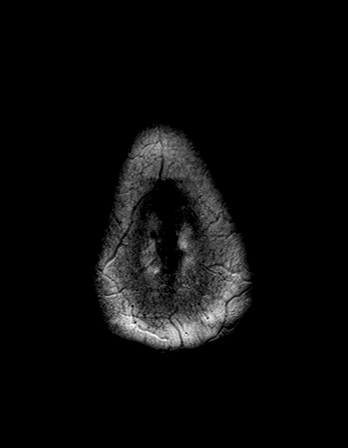

[Series 8: FLAIR · axial · 3.0mm · 0.45mm/px · z∈[-43,+86]mm · 3 of 30 slices shown]
[im 1/30]
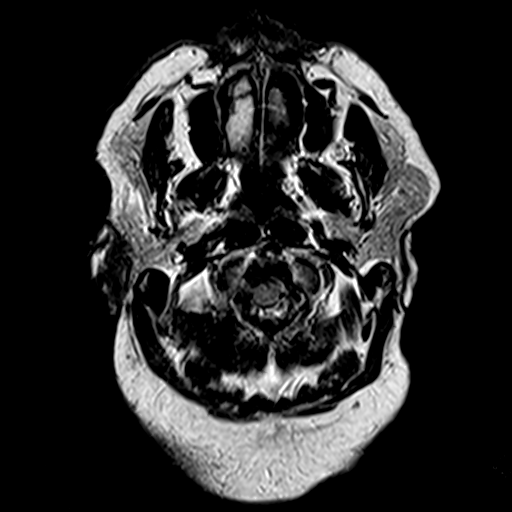
[im 15/30]
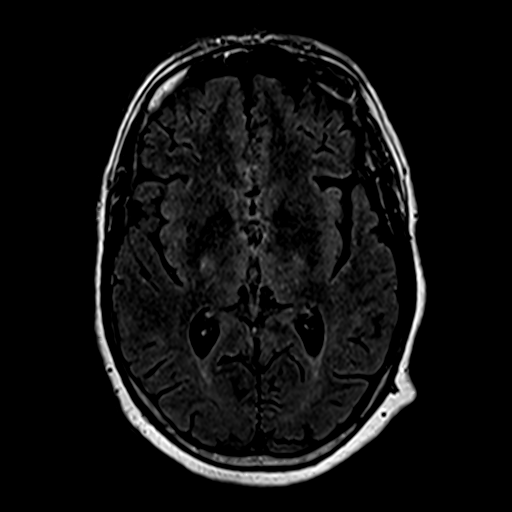
[im 30/30]
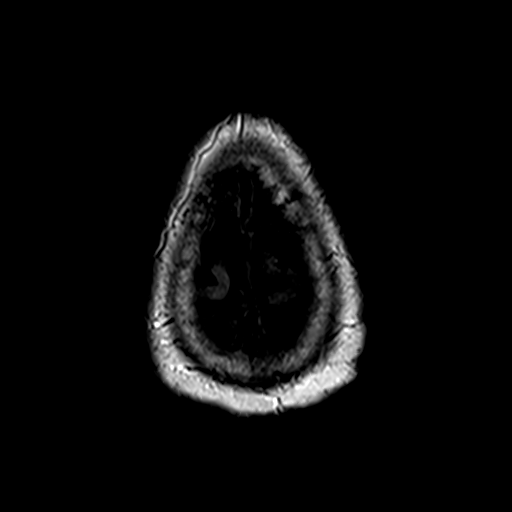

[Series 10: swi_images · axial · 4.0mm · 0.90mm/px · z∈[-45,+89]mm · 3 of 36 slices shown]
[im 1/36]
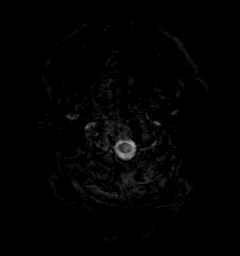
[im 18/36]
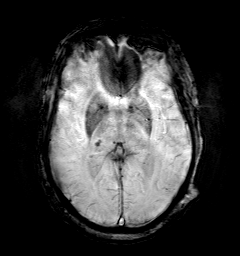
[im 36/36]
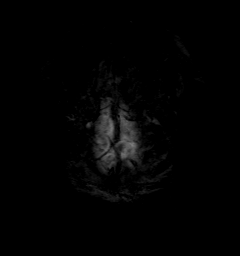

[Series 11: t1_mpr_tra · axial · 1.0mm · 0.71mm/px · z∈[-46,+91]mm · 13 of 144 slices shown]
[im 1/144]
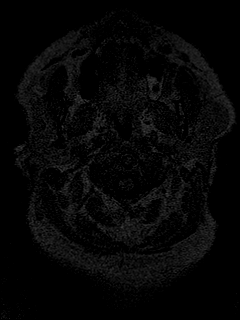
[im 12/144]
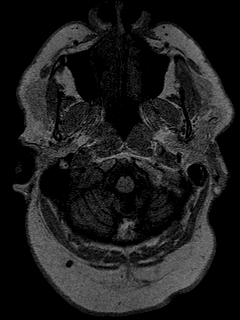
[im 24/144]
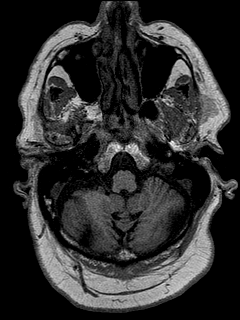
[im 36/144]
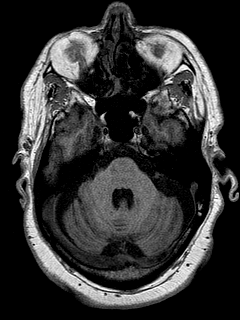
[im 48/144]
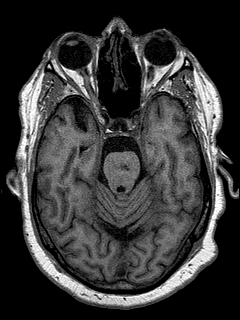
[im 60/144]
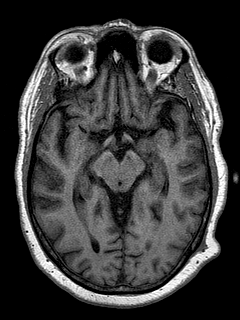
[im 72/144]
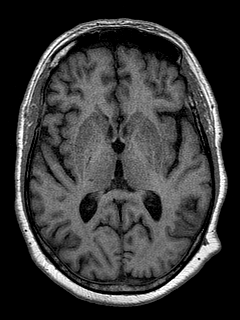
[im 84/144]
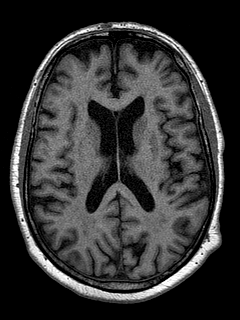
[im 96/144]
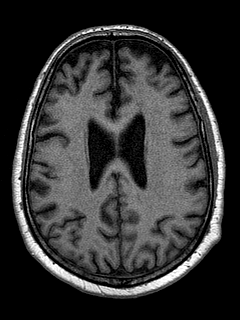
[im 108/144]
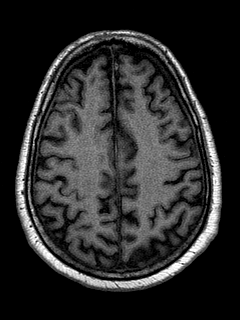
[im 120/144]
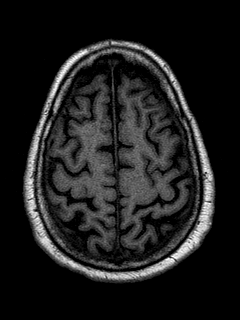
[im 132/144]
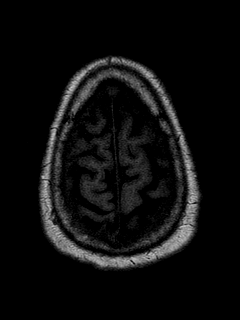
[im 144/144]
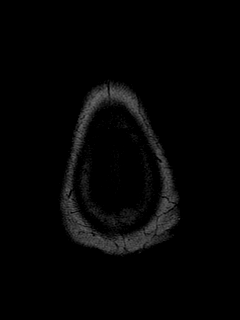

[Series 12: T2 · coronal · 5.0mm · 0.45mm/px · 2 of 25 slices shown (2 of 2)]
[im 1/25]
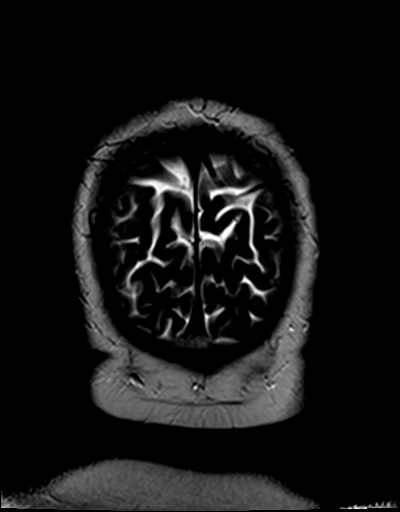
[im 25/25]
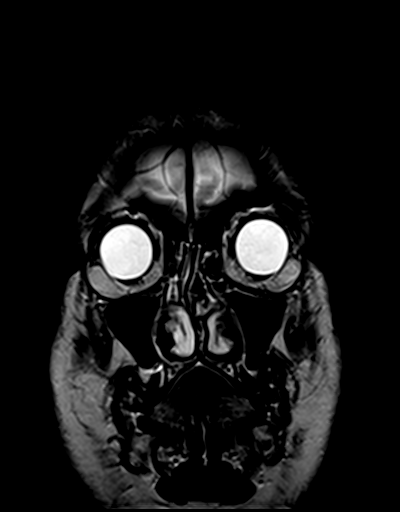

[48 of 48 positions shown; findings below may reference images not displayed]

FINDINGS: Brain: There is no acute infarction or intracranial hemorrhage.
There is no intracranial mass, mass effect, or edema. There is no
hydrocephalus or extra-axial fluid collection.

Patchy T2 hyperintensity in the supratentorial white matter is
nonspecific but may reflect mild chronic microvascular ischemic
changes. Bilateral thalamic and left putaminal foci of
susceptibility likely reflect chronic hypertensive microhemorrhages.
Ventricles and sulci are within normal limits in size and
configuration.

Vascular: Major vessel flow voids at the skull base are preserved.

Skull and upper cervical spine: Normal marrow signal is preserved.

Sinuses/Orbits: Minor mucosal thickening.  Orbits are unremarkable.

Other: Sella is unremarkable.  Mastoid air cells are clear.
IMPRESSION: No evidence of recent infarction, hemorrhage, or mass.

Mild chronic microvascular ischemic changes. Mild burden of chronic
hypertensive microhemorrhages.

## 2020-05-30 ENCOUNTER — Other Ambulatory Visit: Payer: Self-pay

## 2020-05-30 ENCOUNTER — Ambulatory Visit: Payer: Medicare PPO | Admitting: Cardiovascular Disease

## 2020-05-30 ENCOUNTER — Encounter: Payer: Self-pay | Admitting: Cardiovascular Disease

## 2020-05-30 VITALS — BP 130/80 | HR 67 | Ht 66.0 in | Wt 238.0 lb

## 2020-05-30 DIAGNOSIS — I480 Paroxysmal atrial fibrillation: Secondary | ICD-10-CM | POA: Diagnosis not present

## 2020-05-30 DIAGNOSIS — R0602 Shortness of breath: Secondary | ICD-10-CM

## 2020-05-30 DIAGNOSIS — I10 Essential (primary) hypertension: Secondary | ICD-10-CM

## 2020-05-30 DIAGNOSIS — E785 Hyperlipidemia, unspecified: Secondary | ICD-10-CM

## 2020-05-30 MED ORDER — METOPROLOL SUCCINATE ER 100 MG PO TB24: 100.0000 mg | ORAL_TABLET | Freq: Every day | ORAL | 1 refills | Status: DC

## 2020-05-30 NOTE — Progress Notes (Signed)
Cardiology Office Note   Date:  05/30/2020   ID:  Steven Bury MD, DOB Jun 01, 1946, MRN [id number]  PCP:  Audley Hose, MD  Cardiologist: Stark Klein chief complaint on file.     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 male who presents for a follow-up visit regarding paroxysmal atrial fibrillation . He moved from [place]. He is a retired Software engineer. He has chronic medical conditions that include prostate cancer, essential hypertension, DM, previous TIA, hyperlipidemia and obesity. He had his previous cardiovascular care in [place]. He started having palpitations in 2018 with fatigue. He was initially seen by general cardiology with negative Holter monitor. He had a stress echocardiogram done which showed borderline ST depression in the inferior leads with no convincing evidence of ischemia. EF was reported to be normal. He continued to have palpitations. He was seen by EP (Dr. Adline Potter)  for abnormal Zio monitor. It showed multiple activation due to atrial flutter and fibrillation with a maximum heart rate of 250 bpm. Overall burden was 3%. He improved after switching carvedilol to metoprolol.  During my initial evaluation, the patient was felt to be minimally symptomatic from atrial fibrillation and thus I continued medical therapy.  Over the last few months, he has experienced significant worsening of exertional dyspnea without chest pain.  He has occasional palpitations but no increase in burden from before.  No significant leg edema.  He thinks he takes metoprolol tartrate at home and not succinate.  He takes the medication once daily.   Past Medical History:  Diagnosis Date  . Cancer ([place])   . Carcinoma in situ of prostate   . Diabetes mellitus without complication ([place])   . History of transient ischemic attack (TIA)   . Hyperlipidemia   . Hypertension   . Ischemia   . Obesity   . PAF (paroxysmal atrial fibrillation) ([place])   .  Palpitations   . Prostate CA ([place])   . SOB (shortness of breath)     Past Surgical History:  Procedure Laterality Date  . IR CATHETER TUBE CHANGE  01/13/2018  . PROSTATE SURGERY  10/2016   Seeds implanted     Current Outpatient Medications  Medication Sig Dispense Refill  . atorvastatin (LIPITOR) 20 MG tablet Take 20 mg by mouth daily.    Marland Kitchen atorvastatin (LIPITOR) 40 MG tablet     . lisinopril-hydrochlorothiazide (PRINZIDE,ZESTORETIC) 20-25 MG tablet Take 1 tablet by mouth daily.    . metFORMIN (GLUCOPHAGE) 500 MG tablet Take 500 mg by mouth 2 (two) times daily with a meal.    . metoprolol succinate (TOPROL-XL) 100 MG 24 hr tablet Take 1 tablet (100 mg total) by mouth daily. Take with or immediately following a meal. 90 tablet 1  . rivaroxaban (XARELTO) 20 MG TABS tablet Take 1 tablet (20 mg total) by mouth daily with supper. 30 tablet 5  . VENTOLIN HFA 108 (90 Base) MCG/ACT inhaler INL 1-2 PFS PO Q 6 H PRN SOB AND WHEEZING  1  . acetaminophen (TYLENOL) 500 MG tablet Take 1,000 mg by mouth every 8 (eight) hours as needed for moderate pain.    Marland Kitchen belladonna-opium (B&O SUPPRETTES) 16.2-30 MG suppository Place 1 suppository rectally every 8 (eight) hours as needed for pain. (Patient not taking: Reported on 05/04/2019) 20 suppository 0  . cefdinir (OMNICEF) 300 MG capsule Take 1 capsule (300 mg total) by mouth 2 (two) times daily. (Patient not taking: Reported on 12/14/2017)  14 capsule 0  . co-enzyme Q-10 30 MG capsule Take 30 mg by mouth daily.    Marland Kitchen doxycycline (VIBRAMYCIN) 100 MG capsule Take 100 mg by mouth daily.    . Omega-3 Fatty Acids (FISH OIL) 1000 MG CAPS Take 1 capsule by mouth daily.    Marland Kitchen oxyCODONE (OXY IR/ROXICODONE) 5 MG immediate release tablet Take 5 mg by mouth every 4 (four) hours as needed for pain.  0  . tamsulosin (FLOMAX) 0.4 MG CAPS capsule Take 0.4 mg by mouth 2 (two) times daily.     . traMADol (ULTRAM) 50 MG tablet Take 1 tablet (50 mg total) by mouth every 6 (six)  hours as needed. (Patient not taking: Reported on 05/04/2019) 15 tablet 0  . Turmeric 500 MG TABS Take 1,000 mg by mouth daily.      No current facility-administered medications for this visit.    Allergies:   Patient has no known allergies.    Social History:  The patient  reports that he has never smoked. He has never used smokeless tobacco.   Family History:  The patient's family history includes Breast cancer in his mother; Diabetes in his father; Stroke in his father.    ROS:  Please see the history of present illness.   Otherwise, review of systems are positive for none.   All other systems are reviewed and negative.    PHYSICAL EXAM: VS:  BP 130/80   Pulse 67   Ht 5\' 6"  (1.676 m)   Wt 238 lb (108 kg)   SpO2 97%   BMI 38.41 kg/m  , BMI Body mass index is 38.41 kg/m. GEN: Well nourished, well developed, in no acute distress  HEENT: normal  Neck: no JVD, carotid bruits, or masses Cardiac: RRR; no murmurs, rubs, or gallops,no edema  Respiratory:  clear to auscultation bilaterally, normal work of breathing GI: soft, nontender, nondistended, + BS MS: no deformity or atrophy  Skin: warm and dry, no rash Neuro:  Strength and sensation are intact Psych: euthymic mood, full affect   EKG:  EKG is ordered today. The ekg ordered today demonstrates sinus rhythm with first-degree AV block. No significant ST or T wave changes.  Heart rate is 63 bpm.   Recent Labs: No results found for requested labs within last 8760 hours.    Lipid Panel No results found for: CHOL, TRIG, HDL, CHOLHDL, VLDL, LDLCALC, LDLDIRECT    Wt Readings from Last 3 Encounters:  05/30/20 238 lb (108 kg)  05/04/19 231 lb 9.6 oz (105.1 kg)  05/06/17 247 lb 12.8 oz (112.4 kg)       PAD Screen 05/06/2017  Previous PAD dx? No  Previous surgical procedure? No  Pain with walking? No  Feet/toe relief with dangling? No  Painful, non-healing ulcers? No  Extremities discolored? No      ASSESSMENT AND  PLAN:  1.  Exertional dyspnea: The patient reports significant worsening of exertional dyspnea over the last few months which is worrisome for possible angina equivalent given the multiple risk factors including diabetes.  Fortunately, baseline EKG does not show any ischemic changes.  I recommend evaluation with CTA of the coronary arteries with FFR.  2. Paroxysmal atrial fibrillation/flutter: A. fib burden seems to be overall low.  Continue treatment with metoprolol.  Continue long-term anticoagulation with Xarelto.  Echocardiogram last year was unremarkable.    3. Essential hypertension: Blood pressure is controlled on metoprolol and lisinopril hydrochlorothiazide.  4. Hyperlipidemia: Currently on atorvastatin 20 mg daily  with a target LDL of less than 70 given diabetes.  Most recent lipid profile showed an LDL of 70.   Disposition:   FU with me in 6 months  Signed,  Kathlyn Sacramento, MD  05/30/2020 5:38 PM    [place]

## 2020-05-30 NOTE — Patient Instructions (Addendum)
Medication Instructions:  Take your Metoprolol 100 mg two hours before your CT when scheduled.  *If you need a refill on your cardiac medications before your next appointment, please call your pharmacy*   Lab Work: BMET (one week before CT when scheduled, n o lab appointment needed)  If you have labs (blood work) drawn today and your tests are completely normal, you will receive your results only by: Marland Kitchen MyChart Message (if you have MyChart) OR . A paper copy in the mail If you have any lab test that is abnormal or we need to change your treatment, we will call you to review the results.   Testing/Procedures: Your physician has requested that you have cardiac CT. Cardiac computed tomography (CT) is a painless test that uses an x-ray machine to take clear, detailed pictures of your heart. For further information please visit [email or web address]. Please follow instruction sheet as given.   Follow-Up: At [facility], you and your health needs are our priority.  As part of our continuing mission to provide you with exceptional heart care, we have created designated Provider Care Teams.  These Care Teams include your primary Cardiologist (physician) and Advanced Practice Providers (APPs -  Physician Assistants and Nurse Practitioners) who all work together to provide you with the care you need, when you need it.  We recommend signing up for the patient portal called "MyChart".  Sign up information is provided on this After Visit Summary.  MyChart is used to connect with patients for Virtual Visits (Telemedicine).  Patients are able to view lab/test results, encounter notes, upcoming appointments, etc.  Non-urgent messages can be sent to your provider as well.   To learn more about what you can do with MyChart, go to [email or web address].    Your next appointment:   6 month(s)  The format for your next appointment:   In Person  Provider:   Kathlyn Sacramento, MD   Other  Instructions  Your cardiac CT will be scheduled at one of the below locations:   [facility] [address] [place], [place] [address] [phone]  [facility] [address] [place], [place] [address] [phone]  If scheduled at [facility], please arrive at the [facility] main entrance of [facility] 30 minutes prior to test start time. Proceed to the [facility] Radiology Department (first floor) to check-in and test prep.  If scheduled at [facility], please arrive 15 mins early for check-in and test prep.  Please follow these instructions carefully (unless otherwise directed):  Hold all erectile dysfunction medications at least 3 days (72 hrs) prior to test.  On the Night Before the Test: . Be sure to Drink plenty of water. . Do not consume any caffeinated/decaffeinated beverages or chocolate 12 hours prior to your test. . Do not take any antihistamines 12 hours prior to your test.  On the Day of the Test: . Drink plenty of water. Do not drink any water within one hour of the test. . Do not eat any food 4 hours prior to the test. . You may take your regular medications prior to the test.  . Take metoprolol (Lopressor) two hours prior to test. . HOLD Furosemide/Hydrochlorothiazide morning of the test. . FEMALES- please wear underwire-free bra if available  After the Test: . Drink plenty of water. . After receiving IV contrast, you may experience a mild flushed feeling. This is normal. . On occasion,  you may experience a mild rash up to 24 hours after the test. This is not dangerous. If this occurs, you can take Benadryl 25 mg and increase your fluid intake. . If you experience trouble breathing, this can be serious. If it is severe call 911 IMMEDIATELY. If it is mild, please call our office. . If you take any of these medications: Glipizide/Metformin, Avandament,  Glucavance, please do not take 48 hours after completing test unless otherwise instructed.   Once we have confirmed authorization from your insurance company, we will call you to set up a date and time for your test. Based on how quickly your insurance processes prior authorizations requests, please allow up to 4 weeks to be contacted for scheduling your Cardiac CT appointment. Be advised that routine Cardiac CT appointments could be scheduled as many as 8 weeks after your provider has ordered it.  For non-scheduling related questions, please contact the cardiac imaging nurse navigator should you have any questions/concerns: Marchia Bond, Cardiac Imaging Nurse Navigator Burley Saver, Interim Cardiac Imaging Nurse [facility] Direct Office Dial: [phone]   For scheduling needs, including cancellations and rescheduling, please call Vivien Rota at [phone], option 3.

## 2020-06-16 ENCOUNTER — Telehealth (HOSPITAL_COMMUNITY): Payer: Self-pay | Admitting: Emergency Medicine

## 2020-06-16 NOTE — Telephone Encounter (Signed)
Attempted to call patient regarding upcoming cardiac CT appointment. °Left message on voicemail with name and callback number °Kellan Boehlke RN Navigator Cardiac Imaging °[facility] °[phone] Office °[phone] Cell ° °

## 2020-06-16 NOTE — Telephone Encounter (Signed)
Pt returning phone call regarding upcoming cardiac imaging study; pt verbalizes understanding of appt date/time, parking situation and where to check in, pre-test NPO status and medications ordered, and verified current allergies; name and call back number provided for further questions should they arise Marchia Bond RN [facility] [phone] office [phone] cell  Pt states he will fax me his recent lab results to [phone] (cath lab fax)

## 2020-06-20 ENCOUNTER — Ambulatory Visit (HOSPITAL_COMMUNITY)
Admission: RE | Admit: 2020-06-20 | Discharge: 2020-06-20 | Disposition: A | Discharge status: Home / Self Care | Payer: Medicare PPO | Source: Ambulatory Visit | Attending: Cardiovascular Disease | Admitting: Cardiovascular Disease

## 2020-06-20 ENCOUNTER — Encounter (HOSPITAL_COMMUNITY): Payer: Self-pay

## 2020-06-20 DIAGNOSIS — R0602 Shortness of breath: Secondary | ICD-10-CM

## 2020-06-20 DIAGNOSIS — I2510 Atherosclerotic heart disease of native coronary artery without angina pectoris: Secondary | ICD-10-CM | POA: Insufficient documentation

## 2020-06-20 MED ORDER — NITROGLYCERIN 0.4 MG SL SUBL
SUBLINGUAL_TABLET | SUBLINGUAL | Status: AC
  Administered 2020-06-20: 0.8 mg via SUBLINGUAL
  Filled 2020-06-20: qty 2

## 2020-06-20 MED ORDER — IOHEXOL 350 MG/ML SOLN
80.0000 mL | Freq: Once | INTRAVENOUS | Status: AC | PRN
  Administered 2020-06-20: 80 mL via INTRAVENOUS

## 2020-06-20 MED ORDER — NITROGLYCERIN 0.4 MG SL SUBL: 0.8000 mg | SUBLINGUAL_TABLET | Freq: Once | SUBLINGUAL | Status: AC

## 2020-06-22 ENCOUNTER — Telehealth: Payer: Self-pay | Admitting: Cardiovascular Disease

## 2020-06-22 ENCOUNTER — Encounter: Payer: Self-pay | Admitting: *Deleted

## 2020-06-22 NOTE — Telephone Encounter (Signed)
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 Wanted to know if a televisit would would better for Dr. Fletcher Anon. Please call [phone].

## 2020-06-22 NOTE — Telephone Encounter (Signed)
Patient calling because he would like for his visit with Dr. Fletcher Anon to be virtual. I have changed appointment to virtual and reviewed instructions with the patient.     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   CONSENT FOR VIRTUAL VISIT FOR:  Steven Bury MD  By participating in this virtual visit I agree to the following:  I hereby voluntarily request, consent and authorize [facility] and its employed or contracted physicians, physician assistants, nurse practitioners or other licensed health care professionals (the Practitioner), to provide me with telemedicine health care services (the "Services") as deemed necessary by the treating Practitioner. I acknowledge and consent to receive the Services by the Practitioner via telemedicine. I understand that the telemedicine visit will involve communicating with the Practitioner through live audiovisual communication technology and the disclosure of certain medical information by electronic transmission. I acknowledge that I have been given the opportunity to request an in-person assessment or other available alternative prior to the telemedicine visit and am voluntarily participating in the telemedicine visit.  I understand that I have the right to withhold or withdraw my consent to the use of telemedicine in the course of my care at any time, without affecting my right to future care or treatment, and that the Practitioner or I may terminate the telemedicine visit at any time. I understand that I have the right to inspect all information obtained and/or recorded in the course of the telemedicine visit and may receive copies of available information for a reasonable fee.  I understand that some of the potential risks of receiving the Services via telemedicine include:  Marland Kitchen Delay or interruption in medical evaluation due to technological  equipment failure or disruption; . Information transmitted may not be sufficient (e.g. poor resolution of images) to allow for appropriate medical decision making by the Practitioner; and/or  . In rare instances, security protocols could fail, causing a breach of personal health information.  Furthermore, I acknowledge that it is my responsibility to provide information about my medical history, conditions and care that is complete and accurate to the best of my ability. I acknowledge that Practitioner's advice, recommendations, and/or decision may be based on factors not within their control, such as incomplete or inaccurate data provided by me or distortions of diagnostic images or specimens that may result from electronic transmissions. I understand that the practice of medicine is not an exact science and that Practitioner makes no warranties or guarantees regarding treatment outcomes. I acknowledge that a copy of this consent can be made available to me via my patient portal ([facility]), or I can request a printed copy by calling the office of [facility].    I understand that my insurance will be billed for this visit.   I have read or had this consent read to me. . I understand the contents of this consent, which adequately explains the benefits and risks of the Services being provided via telemedicine.  . I have been provided ample opportunity to ask questions regarding this consent and the Services and have had my questions answered to my satisfaction. . I give my informed consent for the services to be provided through the use of telemedicine in my medical care

## 2020-06-23 HISTORY — PX: CARDIAC CATHETERIZATION: SHX172

## 2020-06-26 ENCOUNTER — Ambulatory Visit: Payer: Medicare PPO

## 2020-06-27 ENCOUNTER — Telehealth (INDEPENDENT_AMBULATORY_CARE_PROVIDER_SITE_OTHER): Payer: Medicare PPO | Admitting: Cardiovascular Disease

## 2020-06-27 ENCOUNTER — Encounter: Payer: Self-pay | Admitting: Cardiovascular Disease

## 2020-06-27 ENCOUNTER — Ambulatory Visit: Payer: Medicare PPO | Attending: Internal Medicine

## 2020-06-27 VITALS — BP 128/78 | Wt 234.0 lb

## 2020-06-27 DIAGNOSIS — Z23 Encounter for immunization: Secondary | ICD-10-CM

## 2020-06-27 DIAGNOSIS — I10 Essential (primary) hypertension: Secondary | ICD-10-CM

## 2020-06-27 DIAGNOSIS — I480 Paroxysmal atrial fibrillation: Secondary | ICD-10-CM | POA: Diagnosis not present

## 2020-06-27 DIAGNOSIS — I25118 Atherosclerotic heart disease of native coronary artery with other forms of angina pectoris: Secondary | ICD-10-CM | POA: Diagnosis not present

## 2020-06-27 DIAGNOSIS — E785 Hyperlipidemia, unspecified: Secondary | ICD-10-CM | POA: Diagnosis not present

## 2020-06-27 NOTE — Patient Instructions (Signed)
Medication Instructions:  No changes *If you need a refill on your cardiac medications before your next appointment, please call your pharmacy*   Lab Work: None ordered If you have labs (blood work) drawn today and your tests are completely normal, you will receive your results only by: Marland Kitchen MyChart Message (if you have MyChart) OR . A paper copy in the mail If you have any lab test that is abnormal or we need to change your treatment, we will call you to review the results.   Testing/Procedures: None ordered   Follow-Up: We will be in touch with you concerning the follow up and possible procedure.

## 2020-06-27 NOTE — Progress Notes (Signed)
   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 He was provided with Vaccine Information Sheet and instruction to access the V-Safe system.   Mr. Silversmith MD was instructed to call 911 with any severe reactions post vaccine: Marland Kitchen Difficulty breathing  . Swelling of face and throat  . A fast heartbeat  . A bad rash all over body  . Dizziness and weakness

## 2020-06-27 NOTE — Progress Notes (Signed)
Virtual Visit via Video Note   This visit type was conducted due to national recommendations for restrictions regarding the COVID-19 Pandemic (e.g. social distancing) in an effort to limit this patient's exposure and mitigate transmission in our community.  Due to his co-morbid illnesses, this patient is at least at moderate risk for complications without adequate follow up.  This format is felt to be most appropriate for this patient at this time.  All issues noted in this document were discussed and addressed.  A limited physical exam was performed with this format.  Please refer to the patient's chart for his consent to telehealth for [facility].       Date:  06/27/2020   ID:  Steven Steven [name], DOB 11/10/1945, MRN [id number] The patient was identified using 2 identifiers.  Patient Location: Home Provider Location: Office/Clinic  PCP:  Audley Hose, Steven [name]  Cardiologist: Dr. Fletcher Anon Electrophysiologist:  None   Evaluation Performed:  Follow-Up Visit  Chief Complaint: Exertional dyspnea  History of Present Illness:    Steven Steven [name] is a 74 y.o. male who was reached via video visit to discuss results of recent cardiac CTA. He has been followed for paroxysmal atrial fibrillation. He moved from [place] in 2018. He is a retired OB/GYN Steven [name]. He has chronic medical conditions that include prostate cancer, essential hypertension, DM, previous TIA, hyperlipidemia and obesity. He had his previous cardiovascular care in [place]. He started having palpitations in 2018 with fatigue. He was initially seen by general cardiology with negative Holter monitor. He had a stress echocardiogram done which showed borderline ST depression in the inferior leads with no convincing evidence of ischemia. EF was reported to be normal. He continued to have palpitations. He was seen by EP (Dr. Adline Potter)  for abnormal Zio monitor. It showed multiple activation due to atrial flutter  and fibrillation with a maximum heart rate of 250 bpm. Overall burden was 3%. He improved after switching carvedilol to metoprolol.  During my initial evaluation, the patient was felt to be minimally symptomatic from atrial fibrillation and thus I continued medical therapy.   He was seen recently for significant worsening of exertional dyspnea without chest discomfort. Due to that, I proceeded with cardiac CTA which showed severely elevated calcium score at 2967 which was 64 percentile for age and sex matched control.  He was noted to have multivessel calcified disease including left main greater than 50%, moderate LAD disease, moderate RCA disease and moderate left circumflex disease.  The patient does not have symptoms concerning for COVID-19 infection (fever, chills, cough, or new shortness of breath).    Past Medical History:  Diagnosis Date  . Cancer ([place])   . Carcinoma in situ of prostate   . Diabetes mellitus without complication ([place])   . History of transient ischemic attack (TIA)   . Hyperlipidemia   . Hypertension   . Ischemia   . Obesity   . PAF (paroxysmal atrial fibrillation) ([place])   . Palpitations   . Prostate CA ([place])   . SOB (shortness of breath)    Past Surgical History:  Procedure Laterality Date  . IR CATHETER TUBE CHANGE  01/13/2018  . PROSTATE SURGERY  10/2016   Seeds implanted     Current Meds  Medication Sig  . atorvastatin (LIPITOR) 40 MG tablet   . fluticasone (FLOVENT HFA) 110 MCG/ACT inhaler Inhale into the lungs 2 (two) times daily.  Marland Kitchen lisinopril-hydrochlorothiazide (PRINZIDE,ZESTORETIC) 20-25 MG tablet Take 1 tablet  by mouth daily.  . metFORMIN (GLUCOPHAGE) 500 MG tablet Take 500 mg by mouth 2 (two) times daily with a meal.  . metoprolol succinate (TOPROL-XL) 100 MG 24 hr tablet Take 1 tablet (100 mg total) by mouth daily. Take with or immediately following a meal.  . rivaroxaban (XARELTO) 20 MG TABS tablet Take 1 tablet (20 mg total) by mouth daily  with supper.  . VENTOLIN HFA 108 (90 Base) MCG/ACT inhaler INL 1-2 PFS PO Q 6 H PRN SOB AND WHEEZING     Allergies:   Patient has no known allergies.   Social History   Tobacco Use  . Smoking status: Never Smoker  . Smokeless tobacco: Never Used  Substance Use Topics  . Alcohol use: Not on file  . Drug use: Not on file     Family Hx: The patient's family history includes Breast cancer in his mother; Diabetes in his father; Stroke in his father.  ROS:   Please see the history of present illness.     All other systems reviewed and are negative.   Prior CV studies:   The following studies were reviewed today:  Reviewed results of recent cardiac CTA with him  Labs/Other Tests and Data Reviewed:    EKG:  No ECG reviewed.  Recent Labs: No results found for requested labs within last 8760 hours.   Recent Lipid Panel No results found for: CHOL, TRIG, HDL, CHOLHDL, LDLCALC, LDLDIRECT  Wt Readings from Last 3 Encounters:  06/27/20 234 lb (106.1 kg)  05/30/20 238 lb (108 kg)  05/04/19 231 lb 9.6 oz (105.1 kg)     Objective:    Vital Signs:  BP 128/78   Wt 234 lb (106.1 kg)   BMI 37.77 kg/m    VITAL SIGNS:  reviewed GEN:  no acute distress EYES:  sclerae anicteric, EOMI - Extraocular Movements Intact RESPIRATORY:  normal respiratory effort, symmetric expansion CARDIOVASCULAR:  no peripheral edema NEURO:  alert and oriented x 3, no obvious focal deficit PSYCH:  normal affect  ASSESSMENT & PLAN:    1.  Exertional dyspnea: Worrisome for angina equivalent.  Recent cardiac CTA showed severely elevated calcium score with significant left main stenosis and mild to moderate calcified disease in all 3 major arteries.  His exertional dyspnea is consistent with class III angina in terms of functional capacity. Due to this and given cardiac CTA finding, I have recommended proceeding with left heart catheterization and possible PCI.  The patient requested a second opinion at  [facility] and will try to facilitate an appointment there with Dr. Ronnald Ramp.   2. Paroxysmal atrial fibrillation/flutter: A. fib burden seems to be overall low.  Continue treatment with metoprolol.  Continue long-term anticoagulation with Xarelto.  Echocardiogram last year was unremarkable.    3. Essential hypertension: Blood pressure is controlled on metoprolol and lisinopril hydrochlorothiazide.  4. Hyperlipidemia: Currently on atorvastatin 20 mg daily with a target LDL of less than 70 given diabetes.  Most recent lipid profile showed an LDL of 70.     Time:   Today, I have spent 15 minutes with the patient with telehealth technology discussing the above problems.     Medication Adjustments/Labs and Tests Ordered: Current medicines are reviewed at length with the patient today.  Concerns regarding medicines are outlined above.   Tests Ordered: No orders of the defined types were placed in this encounter.   Medication Changes: No orders of the defined types were placed in this encounter.   Follow  Up:  In Person in 1 month(s)  Signed, Kathlyn Sacramento, Steven [name]  06/27/2020 8:41 AM    [place]

## 2020-07-05 ENCOUNTER — Telehealth: Payer: Self-pay | Admitting: Cardiovascular Disease

## 2020-07-05 NOTE — Telephone Encounter (Signed)
Called back and spoke with Dr. Chrissie Noa and advised him that Dr. Fletcher Anon is in the hospital today and will be back in the [facility] office in the morning.. I will forward to him and will reach back out to him asap in regards to his 2nd opinion as he discussed with Dr. Fletcher Anon with Dr. Ronnald Ramp at [facility].

## 2020-07-05 NOTE — Telephone Encounter (Signed)
New Message:   Pt says he is supposed to be referred to [facility] for Cath. He would to know the status of this appointment please.

## 2020-07-06 NOTE — Telephone Encounter (Signed)
I talked with Dr. Ronnald Ramp yesterday and he saw the patient today.

## 2020-07-24 HISTORY — PX: CORONARY ARTERY BYPASS GRAFT: SHX141

## 2020-08-04 ENCOUNTER — Telehealth: Payer: Self-pay | Admitting: Cardiovascular Disease

## 2020-08-04 NOTE — Telephone Encounter (Signed)
Pt is going to have 4-vessel CABG scheduled with Dr. Norm Parcel soon and wanted to make sure he would continue to be followed by Dr. Fletcher Anon after the procedure. Advised pt that we follow closely with patients post surgical intervention and that he could expect a follow up visit in our office soon after he is discharge. Pt also wants to communicate to Dr. Fletcher Anon that he has questions about potential referral for cardiac rehabilitation.

## 2020-08-04 NOTE — Telephone Encounter (Signed)
New Message:     Dr Chrissie Noa said Dr Fletcher Anon referred him to Dr Ronnald Ramp at [facility]. He would like Dr Ronnald Ramp first name and his phone number please.

## 2020-08-04 NOTE — Telephone Encounter (Signed)
Patient returning call.

## 2020-08-04 NOTE — Telephone Encounter (Signed)
Left detailed message for pt (okay per DPR) informing him of Dr. Iona Coach name and number: [phone]. Instructed pt to call back if further information is required.

## 2020-09-03 ENCOUNTER — Telehealth: Payer: Self-pay | Admitting: Physician Assistant

## 2020-09-03 NOTE — Telephone Encounter (Signed)
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  Patient previously underwent coronary CT by Dr. Fletcher Anon and was sent to [facility] for second opinion.  He underwent cardiac catheterization that revealed three-vessel disease and he eventually underwent CABG x4 on 08/20/2020 by Dr. Norm Parcel.  He was discharged on 40 mg a.m. and 20 mg p.m. of Lasix along with 20 mEq twice daily of potassium supplement.  For the past few days, he noticed increased "huff and puff" after he tried to stand up and walk around.  He is now back on Xarelto.  He does have more swelling in the leg that had vein harvesting.  He also mentions after he started on the outpatient Lasix, lower extremity edema has been improving.  At the same time he seems to complain of more shortness of breath.  Given the collective features at the presentations, I recommended temporarily increase the Lasix to 40 mg twice daily.  He would need a earlier office visit with [facility] cardiology.  He is currently scheduled to see the in 2 weeks.  However I recommended a year office visit where someone can do a physical exam to see if he is truly volume overloaded.  Note, after major surgery like this, patient can also have anxiety issues that has similar presentation.  I have reviewed his recent records to see if there is another secondary causes for his symptoms.  Hemoglobin 9.2 however stable prior to discharge.  Echocardiogram showed EF greater than 55%.  As mentioned previously, lower extremity edema is improving since discharge.  We will forward to Dr. Fletcher Anon to review.

## 2020-09-04 ENCOUNTER — Telehealth: Payer: Self-pay | Admitting: Cardiovascular Disease

## 2020-09-04 NOTE — Telephone Encounter (Signed)
Thanks

## 2020-09-04 NOTE — Telephone Encounter (Signed)
Returned the call to the patient. He stated that he was having weeping from his donor leg and shortness of breath after his CABG at [facility]. He called and spoke with on call yesterday.   He stated that the weeping has now stopped and the swelling is going down. He does have a private duty nurse that will be coming to take care of him periocally. He has been advised to cal back if anything else is needed.   He has been advised to keep his follow up with [facility] on 09/13/20

## 2020-09-04 NOTE — Telephone Encounter (Signed)
Patient had a CABG done on 11/29 at [facility]. He states that he just got home 48 hours ago. He states that his left leg is swollen from where they were trying to find a vessel during the procedure but his leg is looking better. He states that he feels better this morning and can actually lay down flat. He would like to speak with Dr. Fletcher Anon about this matter. He wants to speak with Dr. Fletcher Anon to make sure that he is getting the appropriate supervision post procedure

## 2020-09-05 ENCOUNTER — Telehealth: Payer: Self-pay | Admitting: Cardiovascular Disease

## 2020-09-05 NOTE — Telephone Encounter (Signed)
Spoke with patient. Patient concerned that since his discharge on Sunday s/p CABG X4 he has been having symptoms. CABG performed at [facility]. At night he develops shortness of breath, arrhythmias and weakness. He reports he feels like he's drowning when he lays down. He's not sure if the arrhythmias is anxiety, PVCs or Afib which he read can occur after ablations. No arrhythmia since 10p last night.   No symptoms at present moment.   Weight is stable. He takes 40mg  of Lasix BID and a potassium supplement. He is restricting his salt.   He does have follow up with [facility] on 12/22.  Will route to MD.

## 2020-09-05 NOTE — Telephone Encounter (Signed)
Returned the call to the patient. He stated that he spoke with [facility] today and his concerns have been addressed. He no longer needed anything. He will see them next week for a follow up.

## 2020-09-05 NOTE — Telephone Encounter (Signed)
This is going to be difficult to solve over the phone.  He needs to come to the office and see one of our APP's.

## 2020-09-05 NOTE — Telephone Encounter (Signed)
Received a call from Dr. Danella Deis. [phone]...would like someone to give him a phone call

## 2020-09-18 ENCOUNTER — Telehealth: Payer: Self-pay | Admitting: Cardiovascular Disease

## 2020-09-18 NOTE — Telephone Encounter (Signed)
Pt called in and is going to [facility] this week for a procedure and needs a fu in office only in about 1 week to 2 weeks with Dr Kirke Corin or a APP.  There are no in office visit available in that time frame.  Please advise

## 2020-09-18 NOTE — Telephone Encounter (Signed)
Called and double booked patient for Dr.Arida, per Misty Stanley, RN for the 18th-  Dr.Touchet agreed to this date, no other questions or concerns.

## 2020-10-09 ENCOUNTER — Encounter: Payer: Self-pay | Admitting: *Deleted

## 2020-10-10 ENCOUNTER — Telehealth: Payer: Self-pay | Admitting: Cardiovascular Disease

## 2020-10-10 ENCOUNTER — Telehealth (INDEPENDENT_AMBULATORY_CARE_PROVIDER_SITE_OTHER): Payer: Medicare PPO | Admitting: Cardiovascular Disease

## 2020-10-10 ENCOUNTER — Encounter: Payer: Self-pay | Admitting: Cardiovascular Disease

## 2020-10-10 VITALS — BP 115/74 | HR 84 | Temp 98.4°F | Ht 66.0 in | Wt 208.4 lb

## 2020-10-10 DIAGNOSIS — I10 Essential (primary) hypertension: Secondary | ICD-10-CM

## 2020-10-10 DIAGNOSIS — I480 Paroxysmal atrial fibrillation: Secondary | ICD-10-CM

## 2020-10-10 DIAGNOSIS — I2510 Atherosclerotic heart disease of native coronary artery without angina pectoris: Secondary | ICD-10-CM

## 2020-10-10 DIAGNOSIS — E785 Hyperlipidemia, unspecified: Secondary | ICD-10-CM | POA: Diagnosis not present

## 2020-10-10 MED ORDER — FUROSEMIDE 40 MG PO TABS: 40.0000 mg | ORAL_TABLET | Freq: Every day | ORAL | 11 refills | Status: DC

## 2020-10-10 NOTE — Progress Notes (Signed)
Virtual Visit via Video Note   This visit type was conducted due to national recommendations for restrictions regarding the COVID-19 Pandemic (e.g. social distancing) in an effort to limit this patient's exposure and mitigate transmission in our community.  Due to his co-morbid illnesses, this patient is at least at moderate risk for complications without adequate follow up.  This format is felt to be most appropriate for this patient at this time.  All issues noted in this document were discussed and addressed.  A limited physical exam was performed with this format.  Please refer to the patient's chart for his consent to telehealth for [facility].       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  Patient Location: Home Provider Location: Office/Clinic  PCP:  Audley Hose, MD  Cardiologist: Dr. 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  He has been followed for paroxysmal atrial fibrillation. He moved from [place] in 2018. He is a retired OB/GYN MD. He has chronic medical conditions that include prostate cancer, essential hypertension, DM, previous TIA, hyperlipidemia and obesity. He was noted to have significant worsening of exertional dyspnea during his office follow-up visit in September.  Thus, he underwent cardiac CTA which showed severely elevated calcium score at 2967 which was 74 percentile for age and sex matched control.  He was noted to have multivessel calcified disease including left main greater than 50%, moderate LAD disease, moderate RCA disease and moderate left circumflex disease.  I recommended  proceeding with left heart catheterization and the patient's preference was to have it done at [facility] and thus I referred him to Dr. Ronnald Ramp.  Cardiac catheterization was performed and confirmed significant diffuse three-vessel coronary artery disease.  Left main disease was not significant, there was 80% diffuse calcified mid LAD disease, 60% ostial left circumflex disease and 70% stenosis in OM 2 and OM 3.  The RCA was occluded in the midsegment.  The patient underwent CABG in November by Dr. Norm Parcel with LIMA to LAD, radial to OM 2, SVG to OM 3 and distal RCA.  In addition, he underwent pulmonary vein ablation and left atrial appendage clipping.  He had postoperative bradycardia that necessitated decreasing the dose of metoprolol.  He has been doing reasonably well overall.  He did have significant left leg swelling where the vein was harvested.  This improved to increasing the dose of furosemide.  He has been taking 80 mg in the morning and 20 mg in the afternoon.  He did follow-up at [facility] and had labs done at the end of December and his creatinine was above baseline at 1.5.  He also had episodes of palpitations and shortness of breath that improved after he increase the dose of metoprolol to 25 mg twice daily.  He lost about 20 pounds since the surgery.  He has not started cardiac rehab..  No chest pain.  The patient does not have symptoms concerning for COVID-19 infection (fever, chills, cough, or new shortness of breath).    Past Medical History:  Diagnosis Date  . Cancer ([place])   . Carcinoma in situ of prostate   . Diabetes  mellitus without complication ([place])   . History of transient ischemic attack (TIA)   . Hyperlipidemia   . Hypertension   . Ischemia   . Obesity   . PAF (paroxysmal atrial fibrillation) ([place])   . Palpitations   . Prostate CA ([place])   . SOB (shortness of breath)    Past Surgical History:  Procedure Laterality Date  . CARDIAC CATHETERIZATION  06/2020   At [facility]: Three-vessel  coronary artery disease with 80% calcified diffuse mid LAD disease, 60% ostial left circumflex, 70% stenosis in OM 3 and OM 2 and occluded mid RCA  . CORONARY ARTERY BYPASS GRAFT  07/2020    CABG by Dr. Norm Parcel with LIMA to LAD, radial to OM 2, SVG to OM 3 and distal RCA.  In addition, he underwent pulmonary vein ablation and left atrial appendage clipping.  . IR CATHETER TUBE CHANGE  01/13/2018  . PROSTATE SURGERY  10/2016   Seeds implanted     Current Meds  Medication Sig  . amLODipine (NORVASC) 5 MG tablet Take 5 mg by mouth daily.  Marland Kitchen aspirin 81 MG chewable tablet Chew 81 mg by mouth daily.  Marland Kitchen atorvastatin (LIPITOR) 20 MG tablet Take 40 mg by mouth daily.  Marland Kitchen atorvastatin (LIPITOR) 40 MG tablet   . fluticasone (FLOVENT HFA) 110 MCG/ACT inhaler Inhale into the lungs 2 (two) times daily.  . furosemide (LASIX) 40 MG tablet Take 40 mg by mouth.  Marland Kitchen lisinopril-hydrochlorothiazide (PRINZIDE,ZESTORETIC) 20-25 MG tablet Take 1 tablet by mouth daily.  . metFORMIN (GLUCOPHAGE) 500 MG tablet Take 500 mg by mouth 2 (two) times daily with a meal.  . metoprolol tartrate (LOPRESSOR) 25 MG tablet Take 25 mg by mouth 2 (two) times daily.  . polyethylene glycol powder (GLYCOLAX/MIRALAX) 17 GM/SCOOP powder Take by mouth.  . potassium chloride SA (KLOR-CON) 20 MEQ tablet Take 20 mEq by mouth 2 (two) times daily.  . rivaroxaban (XARELTO) 20 MG TABS tablet Take 1 tablet (20 mg total) by mouth daily with supper.  . VENTOLIN HFA 108 (90 Base) MCG/ACT inhaler INL 1-2 PFS PO Q 6 H PRN SOB AND WHEEZING     Allergies:   Patient has no known allergies.   Social History   Tobacco Use  . Smoking status: Never Smoker  . Smokeless tobacco: Never Used     Family Hx: The patient's family history includes Breast cancer in his mother; Diabetes in his father; Stroke in his father.  ROS:   Please see the history of present illness.     All other systems reviewed and are negative.   Prior CV studies:   The  following studies were reviewed today:    Labs/Other Tests and Data Reviewed:    EKG:  No ECG reviewed.  Recent Labs: No results found for requested labs within last 8760 hours.   Recent Lipid Panel No results found for: CHOL, TRIG, HDL, CHOLHDL, LDLCALC, LDLDIRECT  Wt Readings from Last 3 Encounters:  10/10/20 208 lb 6.4 oz (94.5 kg)  06/27/20 234 lb (106.1 kg)  05/30/20 238 lb (108 kg)     Objective:    Vital Signs:  BP 115/74 (BP Location: Left Arm, Patient Position: Sitting)   Pulse 84   Temp 98.4 F (36.9 C)   Ht 5\' 6"  (1.676 m)   Wt 208 lb 6.4 oz (94.5 kg)   SpO2 94%   BMI 33.64 kg/m    VITAL SIGNS:  reviewed GEN:  no acute distress EYES:  sclerae anicteric,  EOMI - Extraocular Movements Intact RESPIRATORY:  normal respiratory effort, symmetric expansion CARDIOVASCULAR:  no peripheral edema NEURO:  alert and oriented x 3, no obvious focal deficit PSYCH:  normal affect  ASSESSMENT & PLAN:    1.  Coronary artery disease involving native coronary arteries: status post recent CABG. he is doing well overall and recovering nicely.  He is on low-dose aspirin and not full dose given that he is on long-term anticoagulation with Xarelto.  I referred him to cardiac rehab.  Symptoms before revascularization included shortness of breath without chest pain. I discussed with him the importance of healthy lifestyle changes.  2. Paroxysmal atrial fibrillation/flutter: He did have pulmonary pain ablation and clipping of the left atrial appendage during his recent CABG.  Continue long-term anticoagulation with Xarelto.  Continue metoprolol.  3. Essential hypertension: Blood pressures controlled on current medication.  4. Hyperlipidemia: Currently on atorvastatin 20 mg daily with a target LDL of less than 70 given   5.  Postoperative volume overload: This has improved significantly.  I decrease furosemide to 40 mg once daily and will check basic metabolic profile in 1 week to  ensure that his renal function is back to baseline.     Time:   Today, I have spent 12 minutes with the patient with telehealth technology discussing the above problems.     Medication Adjustments/Labs and Tests Ordered: Current medicines are reviewed at length with the patient today.  Concerns regarding medicines are outlined above.   Tests Ordered: No orders of the defined types were placed in this encounter.   Medication Changes: No orders of the defined types were placed in this encounter.   Follow Up:  In Person in 3 months  Signed, Kathlyn Sacramento, MD  10/10/2020 11:08 AM    [place]

## 2020-10-10 NOTE — Patient Instructions (Signed)
Medication Instructions:  STOP the Potassium DECREASE the Furosemide to 40 mg once daily  *If you need a refill on your cardiac medications before your next appointment, please call your pharmacy*   Lab Work: Your provider would like for you to return in 2 weeks to have the following labs drawn: bmet. You do not need an appointment for the lab. Once in our office lobby there is a podium where you can sign in and ring the doorbell to alert [place] that you are here. The lab is open from 8:00 am to 4:30 pm; closed for lunch from 12:45pm-1:45pm.  If you have labs (blood work) drawn today and your tests are completely normal, you will receive your results only by: Marland Kitchen MyChart Message (if you have MyChart) OR . A paper copy in the mail If you have any lab test that is abnormal or we need to change your treatment, we will call you to review the results.   Testing/Procedures: None ordered   Follow-Up: At [facility], you and your health needs are our priority.  As part of our continuing mission to provide you with exceptional heart care, we have created designated Provider Care Teams.  These Care Teams include your primary Cardiologist (physician) and Advanced Practice Providers (APPs -  Physician Assistants and Nurse Practitioners) who all work together to provide you with the care you need, when you need it.  We recommend signing up for the patient portal called "MyChart".  Sign up information is provided on this After Visit Summary.  MyChart is used to connect with patients for Virtual Visits (Telemedicine).  Patients are able to view lab/test results, encounter notes, upcoming appointments, etc.  Non-urgent messages can be sent to your provider as well.   To learn more about what you can do with MyChart, go to [email or web address].    Your next appointment:   3 month(s)  The format for your next appointment:   In Person  Provider:   Kathlyn Sacramento, MD   Other Instructions A referral  has been placed for Cardiac Rehab. They will reach out to you to schedule this.

## 2020-10-10 NOTE — Telephone Encounter (Signed)
Spoke to patient, advised referral placed today.   Cardiac rehab will review and process insurance prior to reaching out to him to get started.   He verbalized understanding.

## 2020-10-10 NOTE — Telephone Encounter (Signed)
Patient states his brother received a notification that cardiac rehab was approved, but his brother was told he does not need cardiac rehab. Patient states he believes there was a mistake and his brother was contact instead of himself. He would like to know who to call to schedule with Cardiac Rehab.

## 2020-10-16 ENCOUNTER — Encounter (HOSPITAL_COMMUNITY): Payer: Self-pay | Admitting: *Deleted

## 2020-10-16 NOTE — Progress Notes (Signed)
Referral received from Dr. Fletcher Anon for this pt to participate in Cardiac rehab s/p 08/21/20 CABG x 4 with ablation and Left Atrial appendage at [facility].  Pt has completed his follow up appt on 12/29 with Dr. Norm Parcel at [facility] and 1/18 with Dr. Audelia Acton.  Reviewed medical history in care everywhere along with progress note.  Requested 12 lead ekg tracing/image from medical Records at [facility] for comparison during rehab session.  Will forward to support staff for insurance verification and scheduling. Cherre Huger, BSN Cardiac and Training and development officer

## 2020-10-24 ENCOUNTER — Telehealth (HOSPITAL_COMMUNITY): Payer: Self-pay

## 2020-10-24 NOTE — Telephone Encounter (Signed)
Called and spoke with pt in regards to CR, pt stated he was already scheduled with "[facility], for 4/19". Attempted to adv pt he was not schedule with our department but was schedule with Dr. Fletcher Anon office (per his appt desk). Pt also stated that he thought about it and he don't need CR at this time.   Closed referral, adv pt to contact CR when/if he would like to schedule.

## 2020-11-08 NOTE — Telephone Encounter (Signed)
Patient is following up regarding cardiac rehab referral.  He states he is ready to begin cardiac rehab, but he has not been contacted to schedule it. He would like to know if Dr. Fletcher Anon has an update for him regarding the cardiac rehab. Will a new referral need to be placed? Please advise.

## 2020-11-08 NOTE — Telephone Encounter (Signed)
Spoke to patient he stated he was ready to start Cardiac rehab.Stated he did receive a call from rehab and he was told he cannot start until 4/19.Stated he told them that was to far out.Stated he messed up did not take that appointment.He would like appointment now.Advised I will send message to cardiac rehab.Message sent to cardiac rehab.

## 2020-11-09 ENCOUNTER — Telehealth (HOSPITAL_COMMUNITY): Payer: Self-pay

## 2020-11-09 NOTE — Telephone Encounter (Signed)
Pt stated he is now interested in CR and would like to schedule. Patient will come in for orientation on 12/07/20 @ 8AM and will attend the 3PM exercise class. Went over insurance, patient verbalized understanding.   Tourist information centre manager.

## 2020-12-01 ENCOUNTER — Telehealth (HOSPITAL_COMMUNITY): Payer: Self-pay

## 2020-12-06 ENCOUNTER — Encounter (HOSPITAL_COMMUNITY)
Admission: RE | Admit: 2020-12-06 | Discharge: 2020-12-06 | Disposition: A | Discharge status: Home / Self Care | Payer: Medicare PPO | Source: Ambulatory Visit | Attending: Cardiovascular Disease | Admitting: Cardiovascular Disease

## 2020-12-06 ENCOUNTER — Telehealth (HOSPITAL_COMMUNITY): Payer: Self-pay | Admitting: *Deleted

## 2020-12-06 DIAGNOSIS — Z951 Presence of aortocoronary bypass graft: Secondary | ICD-10-CM | POA: Insufficient documentation

## 2020-12-06 NOTE — Telephone Encounter (Signed)
Confirmed appointment. Completed health history.Barnet Pall, RN,BSN 12/06/2020 4:24 PM

## 2020-12-07 ENCOUNTER — Encounter (HOSPITAL_COMMUNITY)
Admission: RE | Admit: 2020-12-07 | Discharge: 2020-12-07 | Disposition: A | Discharge status: Home / Self Care | Payer: Medicare PPO | Source: Ambulatory Visit | Attending: Cardiovascular Disease | Admitting: Cardiovascular Disease

## 2020-12-07 ENCOUNTER — Other Ambulatory Visit: Payer: Self-pay

## 2020-12-07 ENCOUNTER — Encounter (HOSPITAL_COMMUNITY): Payer: Self-pay

## 2020-12-07 VITALS — BP 98/72 | HR 75 | Ht 65.5 in | Wt 213.0 lb

## 2020-12-07 DIAGNOSIS — Z951 Presence of aortocoronary bypass graft: Secondary | ICD-10-CM | POA: Diagnosis not present

## 2020-12-07 HISTORY — DX: Atherosclerotic heart disease of native coronary artery without angina pectoris: I25.10

## 2020-12-07 LAB — GLUCOSE, CAPILLARY: Glucose-Capillary: 116 mg/dL — ABNORMAL HIGH (ref 70–99)

## 2020-12-07 NOTE — Progress Notes (Signed)
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  amLODipine (NORVASC) 5 MG tablet, Take 5 mg by mouth daily., Disp: , Rfl:  .  aspirin 81 MG chewable tablet, Chew 81 mg by mouth daily., Disp: , Rfl:  .  atorvastatin (LIPITOR) 20 MG tablet, Take 40 mg by mouth daily., Disp: , Rfl:  .  furosemide (LASIX) 40 MG tablet, Take 1 tablet (40 mg total) by mouth daily., Disp: 30 tablet, Rfl: 11 .  metFORMIN (GLUCOPHAGE) 500 MG tablet, Take 500 mg by mouth 2 (two) times daily with a meal., Disp: , Rfl:  .  metoprolol tartrate (LOPRESSOR) 25 MG tablet, Take 12.5 mg by mouth 2 (two) times daily., Disp: , Rfl:  .  potassium chloride SA (KLOR-CON) 20 MEQ tablet, Take 20 mEq by mouth daily., Disp: , Rfl:  .  rivaroxaban (XARELTO) 20 MG TABS tablet, Take 1 tablet (20 mg total) by mouth daily with supper., Disp: 30 tablet, Rfl: 5  Past Medical History: Past Medical History:  Diagnosis Date  . Cancer ([place])   . Carcinoma in situ of prostate   . Coronary artery disease   . Diabetes mellitus without complication ([place])   . History of transient ischemic attack (TIA)   . Hyperlipidemia   . Hypertension   . Ischemia   . Obesity   . PAF (paroxysmal atrial fibrillation) ([place])   . Palpitations   . Prostate CA ([place])   . SOB (shortness of breath)     Tobacco Use: Social History   Tobacco Use  Smoking Status Never Smoker  Smokeless Tobacco Never Used     Labs: Recent Review Flowsheet Data   There is no flowsheet data to display.     Capillary Blood Glucose: Lab Results  Component Value Date   GLUCAP 116 (H) 12/07/2020   GLUCAP 99 01/13/2018     Exercise Target Goals: Exercise Program Goal: Individual exercise prescription set using results from initial 6 min walk test and THRR while considering  patient's activity barriers and safety.   Exercise Prescription Goal: Starting with aerobic activity 30 plus minutes a day, 3 days per week for initial exercise prescription. Provide home exercise prescription and guidelines that participant acknowledges understanding prior to discharge.  Activity Barriers & Risk Stratification:  Activity Barriers & Cardiac Risk Stratification - 12/07/20 0940      Activity Barriers & Cardiac Risk Stratification   Activity Barriers Muscular Weakness    Cardiac Risk Stratification High   MET's 1.85          6 Minute Walk:  6 Minute Walk    Row Name 12/07/20 0842         6 Minute Walk   Phase Initial     Distance 1140 feet     Walk Time 6 minutes     # of Rest Breaks 0     MPH 2.16     METS 1.85     RPE  9     Perceived Dyspnea  0     VO2 Peak 6.47     Symptoms No     Resting HR 75 bpm     Resting BP 98/72     Resting Oxygen Saturation  97 %     Exercise Oxygen Saturation  during 6 min walk 97 %     Max Ex. HR 93 bpm     Max Ex. BP 116/72     2 Minute Post BP 112/68            Oxygen Initial Assessment:   Oxygen Re-Evaluation:   Oxygen Discharge (Final Oxygen Re-Evaluation):   Initial Exercise Prescription:  Initial Exercise Prescription - 12/07/20 0900      Date of Initial Exercise RX and Referring Provider   Date 12/07/20    Referring Provider Kathlyn Sacramento, MD    Expected Discharge Date 02/02/21      Treadmill   MPH --    Grade --    Minutes --    METs --      NuStep   Level 2    SPM 80    Minutes 15    METs 2      Track   Laps 12    Minutes 15     METs 2.39      Prescription Details   Frequency (times per week) 3    Duration Progress to 30 minutes of continuous aerobic without signs/symptoms of physical distress      Intensity   THRR 40-80% of Max Heartrate 59-118    Ratings of Perceived Exertion 11-13    Perceived Dyspnea 0-4      Progression   Progression Continue progressive overload as per policy without signs/symptoms or physical distress.      Resistance Training   Training Prescription Yes    Weight 3    Reps 10-15           Perform Capillary Blood Glucose checks as needed.  Exercise Prescription Changes:   Exercise Comments:   Exercise Goals and Review:   Exercise Goals    Row Name 12/07/20 0947             Exercise Goals   Increase Physical Activity Yes       Intervention Provide advice, education, support and counseling about physical activity/exercise needs.;Develop an individualized exercise prescription for aerobic and resistive training based on initial evaluation findings, risk stratification, comorbidities and participant's personal goals.       Expected Outcomes Short Term: Attend rehab on a regular basis to increase amount of physical activity.;Long Term: Add in home exercise to make exercise part of routine and to increase amount of physical activity.;Long Term: Exercising regularly at least 3-5 days a week.       Increase Strength and Stamina Yes       Intervention Provide advice, education, support and counseling about physical activity/exercise needs.;Develop an individualized exercise prescription for aerobic and resistive training based on initial evaluation findings, risk stratification, comorbidities and participant's personal goals.       Expected Outcomes Short Term: Increase workloads from initial exercise prescription for resistance, speed, and METs.;Short Term: Perform resistance training exercises routinely during rehab and add in resistance training at home;Long Term: Improve  cardiorespiratory fitness, muscular endurance and strength as measured by increased METs and functional capacity (6MWT)       Able to understand and use rate of perceived exertion (RPE) scale Yes  Intervention Provide education and explanation on how to use RPE scale       Expected Outcomes Short Term: Able to use RPE daily in rehab to express subjective intensity level;Long Term:  Able to use RPE to guide intensity level when exercising independently       Knowledge and understanding of Target Heart Rate Range (THRR) Yes       Intervention Provide education and explanation of THRR including how the numbers were predicted and where they are located for reference       Expected Outcomes Short Term: Able to state/look up THRR;Short Term: Able to use daily as guideline for intensity in rehab;Long Term: Able to use THRR to govern intensity when exercising independently       Understanding of Exercise Prescription Yes       Intervention Provide education, explanation, and written materials on patient's individual exercise prescription       Expected Outcomes Short Term: Able to explain program exercise prescription;Long Term: Able to explain home exercise prescription to exercise independently              Exercise Goals Re-Evaluation :    Discharge Exercise Prescription (Final Exercise Prescription Changes):   Nutrition:  Target Goals: Understanding of nutrition guidelines, daily intake of sodium <1575m, cholesterol <2072m calories 30% from fat and 7% or less from saturated fats, daily to have 5 or more servings of fruits and vegetables.  Biometrics:  Pre Biometrics - 12/07/20 0933      Pre Biometrics   Waist Circumference 48.5 inches    Hip Circumference 47 inches    Waist to Hip Ratio 1.03 %    Triceps Skinfold 29 mm    % Body Fat 37.6 %    Grip Strength 36 kg    Flexibility 7 in    Single Leg Stand 30 seconds            Nutrition Therapy Plan and Nutrition  Goals:   Nutrition Assessments:  MEDIFICTS Score Key:  ?70 Need to make dietary changes   40-70 Heart Healthy Diet  ? 40 Therapeutic Level Cholesterol Diet   Picture Your Plate Scores:  <4<27nhealthy dietary pattern with much room for improvement.  41-50 Dietary pattern unlikely to meet recommendations for good health and room for improvement.  51-60 More healthful dietary pattern, with some room for improvement.   >60 Healthy dietary pattern, although there may be some specific behaviors that could be improved.    Nutrition Goals Re-Evaluation:   Nutrition Goals Discharge (Final Nutrition Goals Re-Evaluation):   Psychosocial: Target Goals: Acknowledge presence or absence of significant depression and/or stress, maximize coping skills, provide positive support system. Participant is able to verbalize types and ability to use techniques and skills needed for reducing stress and depression.  Initial Review & Psychosocial Screening:  Initial Psych Review & Screening - 12/07/20 1047      Initial Review   Current issues with None Identified      Family Dynamics   Good Support System? Yes   Dr WaChrissie Noaas his wife and 3 children for support     Barriers   Psychosocial barriers to participate in program There are no identifiable barriers or psychosocial needs.      Screening Interventions   Interventions Encouraged to exercise           Quality of Life Scores:  Quality of Life - 12/07/20 0951      Quality of Life   Select Quality  of Life      Quality of Life Scores   Health/Function Pre 26.17 %    Socioeconomic Pre 21.25 %    Psych/Spiritual Pre 25.57 %    Family Pre 28.8 %    GLOBAL Pre 25.3 %          Scores of 19 and below usually indicate a poorer quality of life in these areas.  A difference of  2-3 points is a clinically meaningful difference.  A difference of 2-3 points in the total score of the Quality of Life Index has been associated with  significant improvement in overall quality of life, self-image, physical symptoms, and general health in studies assessing change in quality of life.  PHQ-9: Recent Review Flowsheet Data    Depression screen [facility] 2/9 12/07/2020   Decreased Interest 0   Down, Depressed, Hopeless 0   PHQ - 2 Score 0     Interpretation of Total Score  Total Score Depression Severity:  1-4 = Minimal depression, 5-9 = Mild depression, 10-14 = Moderate depression, 15-19 = Moderately severe depression, 20-27 = Severe depression   Psychosocial Evaluation and Intervention:   Psychosocial Re-Evaluation:   Psychosocial Discharge (Final Psychosocial Re-Evaluation):   Vocational Rehabilitation: Provide vocational rehab assistance to qualifying candidates.   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 Participant will state understanding/return demonstration of topics presented.  Learning Barriers/Preferences:  Learning Barriers/Preferences - 12/07/20 0958      Learning Barriers/Preferences   Learning Barriers Sight   bilateral glaucoma   Learning Preferences None           Education Topics: Hypertension, Hypertension Reduction -Define heart disease and high blood pressure. Discus how high blood pressure affects the body and ways to reduce high blood pressure.   Exercise and Your Heart -Discuss why it is important to exercise, the FITT principles of exercise, normal and abnormal responses to exercise, and how to exercise safely.   Angina -Discuss definition of angina, causes of angina, treatment of angina, and how to decrease risk of having  angina.   Cardiac Medications -Review what the following cardiac medications are used for, how they affect the body, and side effects that may occur when taking the medications.  Medications include Aspirin, Beta blockers, calcium channel blockers, ACE Inhibitors, angiotensin receptor blockers, diuretics, digoxin, and antihyperlipidemics.   Congestive Heart Failure -Discuss the definition of CHF, how to live with CHF, the signs and symptoms of CHF, and how keep track of weight and sodium intake.   Heart Disease and Intimacy -Discus the effect sexual activity has on the heart, how changes occur during intimacy as we age, and safety during sexual activity.   Smoking Cessation / COPD -Discuss different methods to quit smoking, the health benefits of quitting smoking, and the definition of COPD.   Nutrition I: Fats -Discuss the types of cholesterol, what cholesterol does to the heart, and how cholesterol levels can be controlled.   Nutrition II: Labels -Discuss the different components of food labels and how to read food label   Heart Parts/Heart Disease and PAD -Discuss the anatomy of the heart, the  pathway of blood circulation through the heart, and these are affected by heart disease.   Stress I: Signs and Symptoms -Discuss the causes of stress, how stress may lead to anxiety and depression, and ways to limit stress.   Stress II: Relaxation -Discuss different types of relaxation techniques to limit stress.   Warning Signs of Stroke / TIA -Discuss definition of a stroke, what the signs and symptoms are of a stroke, and how to identify when someone is having stroke.   Knowledge Questionnaire Score:  Knowledge Questionnaire Score - 12/07/20 1001      Knowledge Questionnaire Score   Pre Score 18/24           Core Components/Risk Factors/Patient Goals at Admission:  Personal Goals and Risk Factors at Admission - 12/07/20 1005      Core Components/Risk Factors/Patient  Goals on Admission    Weight Management Yes    Intervention Weight Management: Develop a combined nutrition and exercise program designed to reach desired caloric intake, while maintaining appropriate intake of nutrient and fiber, sodium and fats, and appropriate energy expenditure required for the weight goal.;Weight Management: Provide education and appropriate resources to help participant work on and attain dietary goals.;Weight Management/Obesity: Establish reasonable short term and long term weight goals.;Obesity: Provide education and appropriate resources to help participant work on and attain dietary goals.    Admit Weight 212 lb 14.4 oz (96.6 kg)    Goal Weight: Long Term 185 lb (83.9 kg)    Expected Outcomes Short Term: Continue to assess and modify interventions until short term weight is achieved;Long Term: Adherence to nutrition and physical activity/exercise program aimed toward attainment of established weight goal;Weight Maintenance: Understanding of the daily nutrition guidelines, which includes 25-35% calories from fat, 7% or less cal from saturated fats, less than 235m cholesterol, less than 1.5gm of sodium, & 5 or more servings of fruits and vegetables daily;Weight Loss: Understanding of general recommendations for a balanced deficit meal plan, which promotes 1-2 lb weight loss per week and includes a negative energy balance of [phone] kcal/d;Understanding of distribution of calorie intake throughout the day with the consumption of 4-5 meals/snacks    Diabetes Yes    Intervention Provide education about signs/symptoms and action to take for hypo/hyperglycemia.;Provide education about proper nutrition, including hydration, and aerobic/resistive exercise prescription along with prescribed medications to achieve blood glucose in normal ranges: Fasting glucose 65-99 mg/dL    Expected Outcomes Short Term: Participant verbalizes understanding of the signs/symptoms and immediate care of  hyper/hypoglycemia, proper foot care and importance of medication, aerobic/resistive exercise and nutrition plan for blood glucose control.;Long Term: Attainment of HbA1C < 7%.    Hypertension Yes    Intervention Provide education on lifestyle modifcations including regular physical activity/exercise, weight management, moderate sodium restriction and increased consumption of fresh fruit, vegetables, and low fat dairy, alcohol moderation, and smoking cessation.;Monitor prescription use compliance.    Expected Outcomes Short Term: Continued assessment and intervention until BP is < 140/992mHG in hypertensive participants. < 130/8042mG in hypertensive participants with diabetes, heart failure or chronic kidney disease.;Long Term: Maintenance of blood pressure at goal levels.    Lipids Yes    Intervention Provide education and support for participant on nutrition & aerobic/resistive exercise along with prescribed medications to achieve LDL <5m40mDL >40mg85m Expected Outcomes Short Term: Participant states understanding of desired cholesterol values and is compliant with medications prescribed. Participant is following exercise prescription and nutrition guidelines.;Long Term: Cholesterol controlled with  medications as prescribed, with individualized exercise RX and with personalized nutrition plan. Value goals: LDL < 53m, HDL > 40 mg.    Personal Goal Other Yes    Personal Goal Pt wants to increase strength and stamina. Be able to walk his stairs at home with ease and help prevent future bad health. Alos wants to reduce his weight to 185 lbs.    Intervention Will work with pt and monitor activity and progress as tolerated           Core Components/Risk Factors/Patient Goals Review:    Core Components/Risk Factors/Patient Goals at Discharge (Final Review):    ITP Comments:  ITP Comments    Row Name 12/07/20 1046           ITP Comments Dr TFransico HimMD, Medical Director               Comments: Dr WChrissie Noaattended orientation on 12/07/2020 to review rules and guidelines for program.  Completed 6 minute walk test, Intitial ITP, and exercise prescription.  VSS. Telemetry-Sinus Rhythm first degree heart block this has been previously documented.  Asymptomatic. Safety measures and social distancing in place per CDC guidelines.MBarnet Pall RN,BSN 12/07/2020 12:14 PM

## 2020-12-11 ENCOUNTER — Encounter (HOSPITAL_COMMUNITY)
Admission: RE | Admit: 2020-12-11 | Discharge: 2020-12-11 | Disposition: A | Discharge status: Home / Self Care | Payer: Medicare PPO | Source: Ambulatory Visit | Attending: Cardiovascular Disease | Admitting: Cardiovascular Disease

## 2020-12-11 ENCOUNTER — Other Ambulatory Visit: Payer: Self-pay

## 2020-12-11 DIAGNOSIS — Z951 Presence of aortocoronary bypass graft: Secondary | ICD-10-CM

## 2020-12-11 LAB — GLUCOSE, CAPILLARY: Glucose-Capillary: 118 mg/dL — ABNORMAL HIGH (ref 70–99)

## 2020-12-11 NOTE — Progress Notes (Signed)
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: [id number] Date of Birth: 1945/10/31 Referring Provider:   Flowsheet Row CARDIAC REHAB PHASE II ORIENTATION from 12/07/2020 in [place]  Referring Provider Kathlyn Sacramento, MD      Encounter Date: 12/11/2020  Check In:  Session Check In - 12/11/20 [place]      Check-In   Supervising physician immediately available to respond to emergencies [facility] immediately available    Physician(s) Dr Tawanna Solo    Location [facility]    Staff Present Barnet Pall, RN, BSN;Other;Olinty Celesta Aver, MS, ACSM CEP, Exercise Physiologist;David Lilyan Punt, MS, EP-C, CCRP;Jessica Hassell Done, MS, ACSM-CEP, Exercise Physiologist   Esmeralda Links EP   Virtual Visit No    Medication changes reported     No    Fall or balance concerns reported    No    Tobacco Cessation No Change    Current number of cigarettes/nicotine per day     0    Warm-up and Cool-down Not performed (comment)   Cardiac Rehab Orientation   Resistance Training Performed Yes    VAD Patient? No    PAD/SET Patient? No      Pain Assessment   Currently in Pain? No/denies    Pain Score 0-No pain    Multiple Pain Sites No           Capillary Blood Glucose: Results for orders placed or performed during the hospital encounter of 12/11/20 (from the past 24 hour(s))  Glucose, capillary     Status: Abnormal   Collection Time: 12/11/20  3:11 PM  Result Value Ref Range   Glucose-Capillary 100 (H) 70 - 99 mg/dL  Glucose, capillary     Status: Abnormal   Collection Time: 12/11/20  4:10 PM  Result Value Ref Range   Glucose-Capillary 118 (H) 70 - 99 mg/dL     Exercise Prescription Changes - 12/11/20 1620      Response to Exercise   Blood Pressure (Admit) 112/74    Blood Pressure (Exercise) 148/82    Blood Pressure (Exit) 132/76    Heart Rate (Admit) 88 bpm    Heart Rate (Exercise) 101 bpm    Heart Rate (Exit) 88 bpm     Rating of Perceived Exertion (Exercise) 11    Symptoms None    Comments Pt's first day in the [facility] program    Duration Progress to 30 minutes of  aerobic without signs/symptoms of physical distress    Intensity THRR unchanged      Progression   Progression Continue to progress workloads to maintain intensity without signs/symptoms of physical distress.    Average METs 2      Resistance Training   Training Prescription Yes    Weight 3 lbs      Interval Training   Interval Training No      NuStep   Level 2    SPM 80    Minutes 15    METs 1.8      Track   Laps 11    Minutes 15    METs 2.28           Social History   Tobacco Use  Smoking Status Never Smoker  Smokeless Tobacco Never Used    Goals Met:  Exercise tolerated well No report of cardiac concerns or symptoms Strength training completed today  Goals Unmet:  Not Applicable  Comments: Dr Chrissie Noa started cardiac rehab today.  Pt tolerated  light exercise without difficulty. VSS, telemetry-Sinus Rhythm first degree heart block, asymptomatic.  Medication list reconciled. Pt denies barriers to medicaiton compliance.  PSYCHOSOCIAL ASSESSMENT:  PHQ-0. Pt exhibits positive coping skills, hopeful outlook with supportive family. No psychosocial needs identified at this time, no psychosocial interventions necessary.    Pt enjoys real estate .   Pt oriented to exercise equipment and routine.    Understanding verbalized.Barnet Pall, RN,BSN 12/12/2020 10:03 AM   Dr. Fransico Him is Medical Director for Cardiac Rehab at [facility].

## 2020-12-12 LAB — GLUCOSE, CAPILLARY: Glucose-Capillary: 100 mg/dL — ABNORMAL HIGH (ref 70–99)

## 2020-12-13 ENCOUNTER — Other Ambulatory Visit: Payer: Self-pay

## 2020-12-13 ENCOUNTER — Encounter (HOSPITAL_COMMUNITY)
Admission: RE | Admit: 2020-12-13 | Discharge: 2020-12-13 | Disposition: A | Discharge status: Home / Self Care | Payer: Medicare PPO | Source: Ambulatory Visit | Attending: Cardiovascular Disease | Admitting: Cardiovascular Disease

## 2020-12-13 DIAGNOSIS — Z951 Presence of aortocoronary bypass graft: Secondary | ICD-10-CM | POA: Diagnosis not present

## 2020-12-13 LAB — GLUCOSE, CAPILLARY
Glucose-Capillary: 134 mg/dL — ABNORMAL HIGH (ref 70–99)
Glucose-Capillary: 154 mg/dL — ABNORMAL HIGH (ref 70–99)

## 2020-12-15 ENCOUNTER — Encounter (HOSPITAL_COMMUNITY): Payer: Medicare PPO

## 2020-12-18 ENCOUNTER — Other Ambulatory Visit: Payer: Self-pay

## 2020-12-18 ENCOUNTER — Encounter (HOSPITAL_COMMUNITY)
Admission: RE | Admit: 2020-12-18 | Discharge: 2020-12-18 | Disposition: A | Discharge status: Home / Self Care | Payer: Medicare PPO | Source: Ambulatory Visit | Attending: Cardiovascular Disease | Admitting: Cardiovascular Disease

## 2020-12-18 VITALS — Wt 209.2 lb

## 2020-12-18 DIAGNOSIS — Z951 Presence of aortocoronary bypass graft: Secondary | ICD-10-CM | POA: Diagnosis not present

## 2020-12-18 NOTE — Progress Notes (Signed)
Steven Bury MD 75 y.o. male Nutrition Note  Diagnosis: CABG x4  Past Medical History:  Diagnosis Date  . Cancer ([place])   . Carcinoma in situ of prostate   . Coronary artery disease   . Diabetes mellitus without complication ([place])   . History of transient ischemic attack (TIA)   . Hyperlipidemia   . Hypertension   . Ischemia   . Obesity   . PAF (paroxysmal atrial fibrillation) ([place])   . Palpitations   . Prostate CA ([place])   . SOB (shortness of breath)      Medications reviewed.   Current Outpatient Medications:  .  amLODipine (NORVASC) 5 MG tablet, Take 5 mg by mouth daily., Disp: , Rfl:  .  aspirin 81 MG chewable tablet, Chew 81 mg by mouth daily., Disp: , Rfl:  .  atorvastatin (LIPITOR) 20 MG tablet, Take 40 mg by mouth daily., Disp: , Rfl:  .  furosemide (LASIX) 40 MG tablet, Take 1 tablet (40 mg total) by mouth daily., Disp: 30 tablet, Rfl: 11 .  metFORMIN (GLUCOPHAGE) 500 MG tablet, Take 500 mg by mouth 2 (two) times daily with a meal., Disp: , Rfl:  .  metoprolol tartrate (LOPRESSOR) 25 MG tablet, Take 12.5 mg by mouth 2 (two) times daily., Disp: , Rfl:  .  potassium chloride SA (KLOR-CON) 20 MEQ tablet, Take 20 mEq by mouth daily., Disp: , Rfl:  .  rivaroxaban (XARELTO) 20 MG TABS tablet, Take 1 tablet (20 mg total) by mouth daily with supper., Disp: 30 tablet, Rfl: 5   Ht Readings from Last 1 Encounters:  12/07/20 5' 5.5" (1.664 m)     Wt Readings from Last 3 Encounters:  12/07/20 212 lb 15.4 oz (96.6 kg)  10/10/20 208 lb 6.4 oz (94.5 kg)  06/27/20 234 lb (106.1 kg)     There is no height or weight on file to calculate BMI.   Social History   Tobacco Use  Smoking Status Never Smoker  Smokeless Tobacco Never Used     No results found for: CHOL No results found for: HDL No results found for: LDLCALC No results found for: TRIG   No results found for: HGBA1C   CBG (last 3)  No results for input(s): GLUCAP in the last 72 hours.   Nutrition  Note  Spoke with pt. Nutrition Plan and Nutrition Survey goals reviewed with pt. Pt is following a Heart Healthy diet. Pt wants to lose wt. Pt has been trying to lose wt by reducing meats, increasing fruits/vegetables, reducing carbs. He has lost 6 lbs. His goal weight is 185 lbs. Reviewed weight history. Normal adult body weight is 220 lbs. He does not remember when he last weighed 185 lbs. He states this is an attainable goal.  Pt is taking metformin 500 mg daily. His last A1C self reported as 5.5. He states he has a strong family hx.   Per discussion, pt does not use canned/convenience foods often. Pt does not add salt to food. Pt does not eat out frequently. He is reading labels. He states plenty of support at home with cooking and grocery shopping.   Diet recall: Breakfast: cheerios, 1% milk Lunch: brown rice and scrambled eggs OR salmon/salad Snack: nabs Dinner: Mixed vegetables, rarely starch, 1-2 time per week chicken or fish. Snack: fruit smoothie made at home (fruit blended with crystal light)  Pt states he is happy with his diet and not interested in making any further changes.  We discussed  sodium, saturated fats, cholesterol, fiber, and added sugars.   Pt expressed understanding of the information reviewed.    Nutrition Diagnosis ? Food-and nutrition-related knowledge deficit related to lack of exposure to information as related to diagnosis of: ? CVD ?   Nutrition Intervention ? Pt's individual nutrition plan reviewed with pt. ? Benefits of adopting Heart Healthy diet discussed when Picture Your Plate reviewed.   ? Pt given handouts for: ? Nutrition I class ? Nutrition II class ?  ? Continue client-centered nutrition education by RD, as part of interdisciplinary care.  Goal(s) ? Pt to identify food quantities necessary to achieve weight loss of 6-24 lb at graduation from cardiac rehab.  ? Pt to build a healthy plate including vegetables, fruits, whole grains, and low-fat  dairy products in a heart healthy meal plan.  Plan:   Will provide client-centered nutrition education as part of interdisciplinary care  Monitor and evaluate progress toward nutrition goal with team.   Michaele Offer, MS, RDN, LDN

## 2020-12-20 ENCOUNTER — Encounter (HOSPITAL_COMMUNITY)
Admission: RE | Admit: 2020-12-20 | Discharge: 2020-12-20 | Disposition: A | Discharge status: Home / Self Care | Payer: Medicare PPO | Source: Ambulatory Visit | Attending: Cardiovascular Disease | Admitting: Cardiovascular Disease

## 2020-12-20 ENCOUNTER — Other Ambulatory Visit: Payer: Self-pay

## 2020-12-20 DIAGNOSIS — Z951 Presence of aortocoronary bypass graft: Secondary | ICD-10-CM | POA: Diagnosis not present

## 2020-12-20 NOTE — Progress Notes (Signed)
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  amLODipine (NORVASC) 5 MG tablet, Take 5 mg by mouth daily., Disp: , Rfl:  .  aspirin 81 MG chewable tablet, Chew 81 mg by mouth daily., Disp: , Rfl:  .  atorvastatin (LIPITOR) 20 MG tablet, Take 40 mg by mouth daily., Disp: , Rfl:  .  furosemide (LASIX) 40 MG tablet, Take 1 tablet (40 mg total) by mouth daily., Disp: 30 tablet, Rfl: 11 .  metFORMIN (GLUCOPHAGE) 500 MG tablet, Take 500 mg by mouth 2 (two) times daily with a meal., Disp: , Rfl:  .  metoprolol tartrate (LOPRESSOR) 25 MG tablet, Take 12.5 mg by mouth 2 (two) times daily., Disp: , Rfl:  .  potassium chloride SA (KLOR-CON) 20 MEQ tablet, Take 20 mEq by mouth daily., Disp: , Rfl:  .  rivaroxaban (XARELTO) 20 MG TABS tablet, Take 1 tablet (20 mg total) by mouth daily with supper., Disp: 30 tablet, Rfl: 5  Past Medical History: Past Medical History:  Diagnosis Date  . Cancer ([place])   . Carcinoma in situ of prostate   . Coronary artery disease   . Diabetes mellitus without complication ([place])   . History of transient ischemic attack (TIA)   . Hyperlipidemia   . Hypertension   . Ischemia   . Obesity   . PAF (paroxysmal atrial fibrillation) ([place])   . Palpitations   . Prostate CA ([place])   . SOB (shortness of breath)     Tobacco Use: Social History   Tobacco Use  Smoking Status Never Smoker  Smokeless Tobacco Never Used     Labs: Recent Review Flowsheet Data   There is no flowsheet data to display.     Capillary Blood Glucose: Lab Results  Component Value Date   GLUCAP 154 (H) 12/13/2020   GLUCAP 134 (H) 12/13/2020   GLUCAP 118 (H) 12/11/2020   GLUCAP 100 (H) 12/11/2020   GLUCAP 116 (H) 12/07/2020     Exercise Target Goals: Exercise Program Goal: Individual exercise prescription set using results from initial 6 min walk test and THRR while considering  patient's activity barriers and safety.   Exercise Prescription Goal: Starting with aerobic activity 30 plus minutes a day, 3 days per week for initial exercise prescription. Provide home exercise prescription and guidelines that participant acknowledges understanding prior to discharge.  Activity Barriers & Risk Stratification:  Activity Barriers & Cardiac Risk Stratification - 12/07/20 0940      Activity Barriers & Cardiac Risk Stratification   Activity Barriers Muscular Weakness    Cardiac Risk Stratification High   MET's 1.85          6 Minute Walk:  6 Minute Walk    Row Name 12/07/20 0842         6 Minute Walk   Phase Initial     Distance 1140 feet     Walk Time 6 minutes     # of Rest  Breaks 0     MPH 2.16     METS 1.85     RPE 9     Perceived Dyspnea  0     VO2 Peak 6.47     Symptoms No     Resting HR 75 bpm     Resting BP 98/72     Resting Oxygen Saturation  97 %     Exercise Oxygen Saturation  during 6 min walk 97 %     Max Ex. HR 93 bpm     Max Ex. BP 116/72     2 Minute Post BP 112/68            Oxygen Initial Assessment:   Oxygen Re-Evaluation:   Oxygen Discharge (Final Oxygen Re-Evaluation):   Initial Exercise Prescription:  Initial Exercise Prescription - 12/07/20 0900      Date of Initial Exercise RX and Referring Provider   Date 12/07/20    Referring Provider Kathlyn Sacramento, MD    Expected Discharge Date 02/02/21      Treadmill   MPH --    Grade --    Minutes --    METs --       NuStep   Level 2    SPM 80    Minutes 15    METs 2      Track   Laps 12    Minutes 15    METs 2.39      Prescription Details   Frequency (times per week) 3    Duration Progress to 30 minutes of continuous aerobic without signs/symptoms of physical distress      Intensity   THRR 40-80% of Max Heartrate 59-118    Ratings of Perceived Exertion 11-13    Perceived Dyspnea 0-4      Progression   Progression Continue progressive overload as per policy without signs/symptoms or physical distress.      Resistance Training   Training Prescription Yes    Weight 3    Reps 10-15           Perform Capillary Blood Glucose checks as needed.  Exercise Prescription Changes:  Exercise Prescription Changes    Row Name 12/11/20 1620             Response to Exercise   Blood Pressure (Admit) 112/74       Blood Pressure (Exercise) 148/82       Blood Pressure (Exit) 132/76       Heart Rate (Admit) 88 bpm       Heart Rate (Exercise) 101 bpm       Heart Rate (Exit) 88 bpm       Rating of Perceived Exertion (Exercise) 11       Symptoms None       Comments Pt's first day in the CRP2 program       Duration Progress to 30 minutes of  aerobic without signs/symptoms of physical distress       Intensity THRR unchanged               Progression   Progression Continue to progress workloads to maintain intensity without signs/symptoms of physical distress.       Average METs 2               Resistance Training   Training Prescription Yes       Weight 3 lbs               Interval Training  Interval Training No               NuStep   Level 2       SPM 80       Minutes 15       METs 1.8               Track   Laps 11       Minutes 15       METs 2.28              Exercise Comments:  Exercise Comments    Row Name 12/11/20 1623           Exercise Comments Pt's firsat day of exercise in the CRP2 program. Pt tolerated thew session well with no complaints and is off to  a good start.              Exercise Goals and Review:  Exercise Goals    Row Name 12/07/20 0947             Exercise Goals   Increase Physical Activity Yes       Intervention Provide advice, education, support and counseling about physical activity/exercise needs.;Develop an individualized exercise prescription for aerobic and resistive training based on initial evaluation findings, risk stratification, comorbidities and participant's personal goals.       Expected Outcomes Short Term: Attend rehab on a regular basis to increase amount of physical activity.;Long Term: Add in home exercise to make exercise part of routine and to increase amount of physical activity.;Long Term: Exercising regularly at least 3-5 days a week.       Increase Strength and Stamina Yes       Intervention Provide advice, education, support and counseling about physical activity/exercise needs.;Develop an individualized exercise prescription for aerobic and resistive training based on initial evaluation findings, risk stratification, comorbidities and participant's personal goals.       Expected Outcomes Short Term: Increase workloads from initial exercise prescription for resistance, speed, and METs.;Short Term: Perform resistance training exercises routinely during rehab and add in resistance training at home;Long Term: Improve cardiorespiratory fitness, muscular endurance and strength as measured by increased METs and functional capacity (6MWT)       Able to understand and use rate of perceived exertion (RPE) scale Yes       Intervention Provide education and explanation on how to use RPE scale       Expected Outcomes Short Term: Able to use RPE daily in rehab to express subjective intensity level;Long Term:  Able to use RPE to guide intensity level when exercising independently       Knowledge and understanding of Target Heart Rate Range (THRR) Yes       Intervention Provide education and explanation of THRR  including how the numbers were predicted and where they are located for reference       Expected Outcomes Short Term: Able to state/look up THRR;Short Term: Able to use daily as guideline for intensity in rehab;Long Term: Able to use THRR to govern intensity when exercising independently       Understanding of Exercise Prescription Yes       Intervention Provide education, explanation, and written materials on patient's individual exercise prescription       Expected Outcomes Short Term: Able to explain program exercise prescription;Long Term: Able to explain home exercise prescription to exercise independently              Exercise Goals Re-Evaluation :  Exercise Goals Re-Evaluation    Row Name 12/11/20 1620             Exercise Goal Re-Evaluation   Exercise Goals Review Increase Physical Activity;Increase Strength and Stamina;Able to understand and use rate of perceived exertion (RPE) scale;Knowledge and understanding of Target Heart Rate Range (THRR);Understanding of Exercise Prescription       Comments Pt's first day of exercise in the CRP2 program. Pt understands the Exercise Rx, RPE scale, and THRR.       Expected Outcomes Will continue to monitor tthe patient and progress exercise workloads as tolerated.               Discharge Exercise Prescription (Final Exercise Prescription Changes):  Exercise Prescription Changes - 12/11/20 1620      Response to Exercise   Blood Pressure (Admit) 112/74    Blood Pressure (Exercise) 148/82    Blood Pressure (Exit) 132/76    Heart Rate (Admit) 88 bpm    Heart Rate (Exercise) 101 bpm    Heart Rate (Exit) 88 bpm    Rating of Perceived Exertion (Exercise) 11    Symptoms None    Comments Pt's first day in the CRP2 program    Duration Progress to 30 minutes of  aerobic without signs/symptoms of physical distress    Intensity THRR unchanged      Progression   Progression Continue to progress workloads to maintain intensity without  signs/symptoms of physical distress.    Average METs 2      Resistance Training   Training Prescription Yes    Weight 3 lbs      Interval Training   Interval Training No      NuStep   Level 2    SPM 80    Minutes 15    METs 1.8      Track   Laps 11    Minutes 15    METs 2.28           Nutrition:  Target Goals: Understanding of nutrition guidelines, daily intake of sodium <1547m, cholesterol <2028m calories 30% from fat and 7% or less from saturated fats, daily to have 5 or more servings of fruits and vegetables.  Biometrics:  Pre Biometrics - 12/07/20 0933      Pre Biometrics   Waist Circumference 48.5 inches    Hip Circumference 47 inches    Waist to Hip Ratio 1.03 %    Triceps Skinfold 29 mm    % Body Fat 37.6 %    Grip Strength 36 kg    Flexibility 7 in    Single Leg Stand 30 seconds            Nutrition Therapy Plan and Nutrition Goals:  Nutrition Therapy & Goals - 12/18/20 1536      Nutrition Therapy   Diet TLC    Drug/Food Interactions Statins/Certain Fruits      Personal Nutrition Goals   Nutrition Goal Pt to identify food quantities necessary to achieve weight loss of 6-24 lb at graduation from cardiac rehab.    Personal Goal #2 Pt to build a healthy plate including vegetables, fruits, whole grains, and low-fat dairy products in a heart healthy meal plan.      Intervention Plan   Intervention Prescribe, educate and counsel regarding individualized specific dietary modifications aiming towards targeted core components such as weight, hypertension, lipid management, diabetes, heart failure and other comorbidities.;Nutrition handout(s) given to patient.    Expected Outcomes Short Term Goal:  Understand basic principles of dietary content, such as calories, fat, sodium, cholesterol and nutrients.;Long Term Goal: Adherence to prescribed nutrition plan.           Nutrition Assessments:  MEDIFICTS Score Key:  ?70 Need to make dietary changes    40-70 Heart Healthy Diet  ? 40 Therapeutic Level Cholesterol Diet  Flowsheet Row CARDIAC REHAB PHASE II EXERCISE from 12/18/2020 in [place]  Picture Your Plate Total Score on Admission 57     Picture Your Plate Scores:  <19 Unhealthy dietary pattern with much room for improvement.  41-50 Dietary pattern unlikely to meet recommendations for good health and room for improvement.  51-60 More healthful dietary pattern, with some room for improvement.   >60 Healthy dietary pattern, although there may be some specific behaviors that could be improved.    Nutrition Goals Re-Evaluation:  Nutrition Goals Re-Evaluation    [place] Name 12/18/20 1536             Goals   Current Weight 209 lb 3.5 oz (94.9 kg)       Expected Outcome Weight loss              Nutrition Goals Discharge (Final Nutrition Goals Re-Evaluation):  Nutrition Goals Re-Evaluation - 12/18/20 1536      Goals   Current Weight 209 lb 3.5 oz (94.9 kg)    Expected Outcome Weight loss           Psychosocial: Target Goals: Acknowledge presence or absence of significant depression and/or stress, maximize coping skills, provide positive support system. Participant is able to verbalize types and ability to use techniques and skills needed for reducing stress and depression.  Initial Review & Psychosocial Screening:  Initial Psych Review & Screening - 12/07/20 1047      Initial Review   Current issues with None Identified      Family Dynamics   Good Support System? 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      Screening Interventions   Interventions Encouraged to exercise           Quality of Life Scores:  Quality of Life - 12/07/20 0951      Quality of Life   Select Quality of Life      Quality of Life Scores   Health/Function  Pre 26.17 %    Socioeconomic Pre 21.25 %    Psych/Spiritual Pre 25.57 %    Family Pre 28.8 %    GLOBAL Pre 25.3 %          Scores of 19 and below usually indicate a poorer quality of life in these areas.  A difference of  2-3 points is a clinically meaningful difference.  A difference of 2-3 points in the total score of the Quality of Life Index has been associated with significant improvement in overall quality of life, self-image, physical symptoms, and general health in studies assessing change in quality of life.  PHQ-9: Recent Review Flowsheet Data    Depression screen [facility] 2/9 12/07/2020   Decreased Interest 0   Down, Depressed, Hopeless 0   PHQ - 2 Score 0     Interpretation of Total Score  Total Score Depression Severity:  1-4 = Minimal depression, 5-9 = Mild depression, 10-14 = Moderate depression, 15-19 = Moderately severe depression,  20-27 = Severe depression   Psychosocial Evaluation and Intervention:   Psychosocial Re-Evaluation:  Psychosocial Re-Evaluation    [place] Name 12/12/20 1141 12/20/20 1621           Psychosocial Re-Evaluation   Current issues with None Identified None Identified      Interventions Encouraged to attend Cardiac Rehabilitation for the exercise Encouraged to attend Cardiac Rehabilitation for the exercise      Continue Psychosocial Services  No Follow up required No Follow up required             Psychosocial Discharge (Final Psychosocial Re-Evaluation):  Psychosocial Re-Evaluation - 12/20/20 1621      Psychosocial Re-Evaluation   Current issues with None Identified    Interventions Encouraged to attend Cardiac Rehabilitation for the exercise    Continue Psychosocial Services  No Follow up required           Vocational Rehabilitation: Provide vocational rehab assistance to qualifying candidates.   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 Participant will state understanding/return demonstration of topics presented.  Learning Barriers/Preferences:  Learning Barriers/Preferences - 12/07/20 0958      Learning Barriers/Preferences   Learning Barriers Sight   bilateral glaucoma   Learning Preferences None           Education Topics: Hypertension, Hypertension Reduction -Define heart disease and high blood pressure. Discus how high blood pressure affects the body and ways to reduce high blood pressure.   Exercise and Your Heart -Discuss why it is important to exercise, the FITT principles of exercise, normal and abnormal responses to exercise, and how to exercise safely.   Angina -Discuss definition of angina, causes of angina, treatment of angina, and how to decrease risk of having angina.   Cardiac Medications -Review what the following cardiac medications are used for, how they affect the body, and side effects that may occur when taking the medications.  Medications include Aspirin, Beta blockers, calcium channel blockers, ACE Inhibitors, angiotensin receptor blockers, diuretics, digoxin, and antihyperlipidemics.   Congestive Heart Failure -Discuss the definition of CHF, how to live with CHF, the signs and symptoms of CHF, and how keep track of weight and sodium intake.   Heart Disease and Intimacy -Discus the effect sexual activity has on the heart, how changes occur during intimacy as we age, and safety during sexual activity.   Smoking Cessation / COPD -Discuss different methods to quit smoking, the health benefits of quitting smoking, and the definition of COPD.   Nutrition I: Fats -Discuss the types of cholesterol, what  cholesterol does to the heart, and how cholesterol levels can be controlled.   Nutrition II: Labels -Discuss the different components of food labels and how to read food label   Heart Parts/Heart Disease and PAD -Discuss the anatomy of the heart, the pathway of blood circulation through the heart, and these are affected by heart disease.   Stress I: Signs and Symptoms -Discuss the causes of stress, how stress may lead to anxiety and depression, and ways to limit stress.   Stress II: Relaxation -Discuss different types of relaxation techniques to limit  stress.   Warning Signs of Stroke / TIA -Discuss definition of a stroke, what the signs and symptoms are of a stroke, and how to identify when someone is having stroke.   Knowledge Questionnaire Score:  Knowledge Questionnaire Score - 12/07/20 1001      Knowledge Questionnaire Score   Pre Score 18/24           Core Components/Risk Factors/Patient Goals at Admission:  Personal Goals and Risk Factors at Admission - 12/07/20 1005      Core Components/Risk Factors/Patient Goals on Admission    Weight Management Yes    Intervention Weight Management: Develop a combined nutrition and exercise program designed to reach desired caloric intake, while maintaining appropriate intake of nutrient and fiber, sodium and fats, and appropriate energy expenditure required for the weight goal.;Weight Management: Provide education and appropriate resources to help participant work on and attain dietary goals.;Weight Management/Obesity: Establish reasonable short term and long term weight goals.;Obesity: Provide education and appropriate resources to help participant work on and attain dietary goals.    Admit Weight 212 lb 14.4 oz (96.6 kg)    Goal Weight: Long Term 185 lb (83.9 kg)    Expected Outcomes Short Term: Continue to assess and modify interventions until short term weight is achieved;Long Term: Adherence to nutrition and physical  activity/exercise program aimed toward attainment of established weight goal;Weight Maintenance: Understanding of the daily nutrition guidelines, which includes 25-35% calories from fat, 7% or less cal from saturated fats, less than 218m cholesterol, less than 1.5gm of sodium, & 5 or more servings of fruits and vegetables daily;Weight Loss: Understanding of general recommendations for a balanced deficit meal plan, which promotes 1-2 lb weight loss per week and includes a negative energy balance of [id number] kcal/d;Understanding of distribution of calorie intake throughout the day with the consumption of 4-5 meals/snacks    Diabetes Yes    Intervention Provide education about signs/symptoms and action to take for hypo/hyperglycemia.;Provide education about proper nutrition, including hydration, and aerobic/resistive exercise prescription along with prescribed medications to achieve blood glucose in normal ranges: Fasting glucose 65-99 mg/dL    Expected Outcomes Short Term: Participant verbalizes understanding of the signs/symptoms and immediate care of hyper/hypoglycemia, proper foot care and importance of medication, aerobic/resistive exercise and nutrition plan for blood glucose control.;Long Term: Attainment of HbA1C < 7%.    Hypertension Yes    Intervention Provide education on lifestyle modifcations including regular physical activity/exercise, weight management, moderate sodium restriction and increased consumption of fresh fruit, vegetables, and low fat dairy, alcohol moderation, and smoking cessation.;Monitor prescription use compliance.    Expected Outcomes Short Term: Continued assessment and intervention until BP is < 140/937mHG in hypertensive participants. < 130/8034mG in hypertensive participants with diabetes, heart failure or chronic kidney disease.;Long Term: Maintenance of blood pressure at goal levels.    Lipids Yes    Intervention Provide education and support for participant on  nutrition & aerobic/resistive exercise along with prescribed medications to achieve LDL <24m34mDL >40mg73m Expected Outcomes Short Term: Participant states understanding of desired cholesterol values and is compliant with medications prescribed. Participant is following exercise prescription and nutrition guidelines.;Long Term: Cholesterol controlled with medications as prescribed, with individualized exercise RX and with personalized nutrition plan. Value goals: LDL < 24mg,74m > 40 mg.    Personal Goal Other Yes    Personal Goal Pt wants to increase strength and stamina. Be able to walk his stairs at home with ease and help  prevent future bad health. Alos wants to reduce his weight to 185 lbs.    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 Patient did go over his target dancing while listening to his music walking on the track. Explained to the patient to stay wiithin target by not doing extra activites during exercise.      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 Patient did go over his target dancing while listening to his music walking on the track. Explained to the patient to stay wiithin target by not doing extra activites during exercise.    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 Dr Burke Keels started exercise on 12/12/20 and did well with exercise 30 Day ITP Review.30 Day ITP Review. 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            Comments: See ITP comments.Barnet Pall, RN,BSN 12/20/2020 4:26 PM

## 2020-12-22 ENCOUNTER — Encounter (HOSPITAL_COMMUNITY): Payer: Medicare PPO

## 2020-12-25 ENCOUNTER — Encounter (HOSPITAL_COMMUNITY): Payer: Medicare PPO

## 2020-12-25 ENCOUNTER — Telehealth (HOSPITAL_COMMUNITY): Payer: Self-pay | Admitting: Internal Medicine

## 2020-12-27 ENCOUNTER — Encounter (HOSPITAL_COMMUNITY)
Admission: RE | Admit: 2020-12-27 | Discharge: 2020-12-27 | Disposition: A | Discharge status: Home / Self Care | Payer: Medicare PPO | Source: Ambulatory Visit | Attending: Cardiovascular Disease | Admitting: Cardiovascular Disease

## 2020-12-27 ENCOUNTER — Other Ambulatory Visit: Payer: Self-pay

## 2020-12-27 DIAGNOSIS — Z951 Presence of aortocoronary bypass graft: Secondary | ICD-10-CM | POA: Diagnosis present

## 2020-12-29 ENCOUNTER — Other Ambulatory Visit: Payer: Self-pay

## 2020-12-29 ENCOUNTER — Encounter (HOSPITAL_COMMUNITY)
Admission: RE | Admit: 2020-12-29 | Discharge: 2020-12-29 | Disposition: A | Discharge status: Home / Self Care | Payer: Medicare PPO | Source: Ambulatory Visit | Attending: Cardiovascular Disease | Admitting: Cardiovascular Disease

## 2020-12-29 DIAGNOSIS — Z951 Presence of aortocoronary bypass graft: Secondary | ICD-10-CM

## 2021-01-01 ENCOUNTER — Encounter (HOSPITAL_COMMUNITY)
Admission: RE | Admit: 2021-01-01 | Discharge: 2021-01-01 | Disposition: A | Discharge status: Home / Self Care | Payer: Medicare PPO | Source: Ambulatory Visit | Attending: Cardiovascular Disease | Admitting: Cardiovascular Disease

## 2021-01-01 ENCOUNTER — Other Ambulatory Visit: Payer: Self-pay

## 2021-01-01 DIAGNOSIS — Z951 Presence of aortocoronary bypass graft: Secondary | ICD-10-CM | POA: Diagnosis not present

## 2021-01-03 ENCOUNTER — Other Ambulatory Visit: Payer: Self-pay

## 2021-01-03 ENCOUNTER — Encounter (HOSPITAL_COMMUNITY)
Admission: RE | Admit: 2021-01-03 | Discharge: 2021-01-03 | Disposition: A | Discharge status: Home / Self Care | Payer: Medicare PPO | Source: Ambulatory Visit | Attending: Cardiovascular Disease | Admitting: Cardiovascular Disease

## 2021-01-03 DIAGNOSIS — Z951 Presence of aortocoronary bypass graft: Secondary | ICD-10-CM

## 2021-01-05 ENCOUNTER — Encounter (HOSPITAL_COMMUNITY)
Admission: RE | Admit: 2021-01-05 | Discharge: 2021-01-05 | Disposition: A | Discharge status: Home / Self Care | Payer: Medicare PPO | Source: Ambulatory Visit | Attending: Cardiovascular Disease | Admitting: Cardiovascular Disease

## 2021-01-05 ENCOUNTER — Other Ambulatory Visit: Payer: Self-pay

## 2021-01-05 DIAGNOSIS — Z951 Presence of aortocoronary bypass graft: Secondary | ICD-10-CM | POA: Diagnosis not present

## 2021-01-08 ENCOUNTER — Encounter (HOSPITAL_COMMUNITY): Payer: Medicare PPO

## 2021-01-09 ENCOUNTER — Ambulatory Visit: Payer: Medicare PPO | Admitting: Cardiovascular Disease

## 2021-01-10 ENCOUNTER — Encounter (HOSPITAL_COMMUNITY)
Admission: RE | Admit: 2021-01-10 | Discharge: 2021-01-10 | Disposition: A | Discharge status: Home / Self Care | Payer: Medicare PPO | Source: Ambulatory Visit | Attending: Cardiovascular Disease | Admitting: Cardiovascular Disease

## 2021-01-10 ENCOUNTER — Other Ambulatory Visit: Payer: Self-pay

## 2021-01-10 VITALS — Wt 211.0 lb

## 2021-01-10 DIAGNOSIS — Z951 Presence of aortocoronary bypass graft: Secondary | ICD-10-CM

## 2021-01-11 NOTE — Progress Notes (Signed)
Reviewed home exercise Rx with patient today. Pt was encouraged to walk 2-3 x week in addition to his CRP2 program. 30-45 minutes encouraged with warm-up, cool-down and stretching. Reviewed THRR of 59-118 and keeping RPE between 11-13. Encouraged hydration. Weather parameters for temperature and humidity reviewed for safe exercise outdoors. Reviewed S/S to terminate exercise and when to call 911 vs. MD. Pt verbalized understanding of the home exercise RX and was provided a copy.  Lesly Rubenstein MS, ACSM-CEP, CCRP

## 2021-01-12 ENCOUNTER — Encounter (HOSPITAL_COMMUNITY): Payer: Medicare PPO

## 2021-01-15 ENCOUNTER — Telehealth (HOSPITAL_COMMUNITY): Payer: Self-pay | Admitting: Internal Medicine

## 2021-01-15 ENCOUNTER — Encounter (HOSPITAL_COMMUNITY): Payer: Medicare PPO

## 2021-01-16 NOTE — Progress Notes (Signed)
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  amLODipine (NORVASC) 5 MG tablet, Take 5 mg by mouth daily., Disp: , Rfl:  .  aspirin 81 MG chewable tablet, Chew 81 mg by mouth daily., Disp: , Rfl:  .  atorvastatin (LIPITOR) 20 MG tablet, Take 40 mg by mouth daily., Disp: , Rfl:  .  furosemide (LASIX) 40 MG tablet, Take 1 tablet (40 mg total) by mouth daily., Disp: 30 tablet, Rfl: 11 .  metFORMIN (GLUCOPHAGE) 500 MG tablet, Take 500 mg by mouth 2 (two) times daily with a meal., Disp: , Rfl:  .  metoprolol tartrate (LOPRESSOR) 25 MG tablet, Take 12.5 mg by mouth 2 (two) times daily., Disp: , Rfl:  .  potassium chloride SA (KLOR-CON) 20 MEQ tablet, Take 20 mEq by mouth daily., Disp: , Rfl:  .  rivaroxaban (XARELTO) 20 MG TABS tablet, Take 1 tablet (20 mg total) by mouth daily with supper., Disp: 30 tablet, Rfl: 5  Past Medical History: Past Medical History:  Diagnosis Date  . Cancer ([place])   . Carcinoma in situ of prostate   . Coronary artery disease   . Diabetes mellitus without complication ([place])   . History of transient ischemic attack (TIA)   . Hyperlipidemia   . Hypertension   . Ischemia   . Obesity   . PAF (paroxysmal atrial fibrillation) ([place])   . Palpitations   . Prostate CA ([place])   . SOB (shortness of breath)     Tobacco Use: Social History   Tobacco Use  Smoking Status Never Smoker  Smokeless Tobacco Never Used     Labs: Recent Review Flowsheet Data   There is no flowsheet data to display.     Capillary Blood Glucose: Lab Results  Component Value Date   GLUCAP 154 (H) 12/13/2020   GLUCAP 134 (H) 12/13/2020   GLUCAP 118 (H) 12/11/2020   GLUCAP 100 (H) 12/11/2020   GLUCAP 116 (H) 12/07/2020     Exercise Target Goals: Exercise Program Goal: Individual exercise prescription set using results from initial 6 min walk test and THRR while considering  patient's activity barriers and safety.   Exercise Prescription Goal: Starting with aerobic activity 30 plus minutes a day, 3 days per week for initial exercise prescription. Provide home exercise prescription and guidelines that participant acknowledges understanding prior to discharge.  Activity Barriers & Risk Stratification:  Activity Barriers & Cardiac Risk Stratification - 12/07/20 0940      Activity Barriers & Cardiac Risk Stratification   Activity Barriers Muscular Weakness    Cardiac Risk Stratification High   MET's 1.85          6 Minute Walk:  6 Minute Walk    Row Name 12/07/20 0842         6 Minute Walk   Phase Initial     Distance 1140 feet     Walk Time 6 minutes     # of Rest  Breaks 0     MPH 2.16     METS 1.85     RPE 9     Perceived Dyspnea  0     VO2 Peak 6.47     Symptoms No     Resting HR 75 bpm     Resting BP 98/72     Resting Oxygen Saturation  97 %     Exercise Oxygen Saturation  during 6 min walk 97 %     Max Ex. HR 93 bpm     Max Ex. BP 116/72     2 Minute Post BP 112/68            Oxygen Initial Assessment:   Oxygen Re-Evaluation:   Oxygen Discharge (Final Oxygen Re-Evaluation):   Initial Exercise Prescription:  Initial Exercise Prescription - 12/07/20 0900      Date of Initial Exercise RX and Referring Provider   Date 12/07/20    Referring Provider Kathlyn Sacramento, MD    Expected Discharge Date 02/02/21      Treadmill   MPH --    Grade --    Minutes --    METs --       NuStep   Level 2    SPM 80    Minutes 15    METs 2      Track   Laps 12    Minutes 15    METs 2.39      Prescription Details   Frequency (times per week) 3    Duration Progress to 30 minutes of continuous aerobic without signs/symptoms of physical distress      Intensity   THRR 40-80% of Max Heartrate 59-118    Ratings of Perceived Exertion 11-13    Perceived Dyspnea 0-4      Progression   Progression Continue progressive overload as per policy without signs/symptoms or physical distress.      Resistance Training   Training Prescription Yes    Weight 3    Reps 10-15           Perform Capillary Blood Glucose checks as needed.  Exercise Prescription Changes:  Exercise Prescription Changes    Row Name 12/11/20 1620 12/27/20 1600 01/10/21 1620         Response to Exercise   Blood Pressure (Admit) 112/74 132/82 122/64     Blood Pressure (Exercise) 148/82 136/80 130/80     Blood Pressure (Exit) 132/76 110/70 108/62     Heart Rate (Admit) 88 bpm 91 bpm 82 bpm     Heart Rate (Exercise) 101 bpm 105 bpm 100 bpm     Heart Rate (Exit) 88 bpm 91 bpm 89 bpm     Rating of Perceived Exertion (Exercise) 11 11 12      Symptoms None None None     Comments Pt's first day in the CRP2 program Reviewed METs Reviewed METs, Goals, and home exercise Rx     Duration Progress to 30 minutes of  aerobic without signs/symptoms of physical distress Continue with 30 min of aerobic exercise without signs/symptoms of physical distress. Continue with 30 min of aerobic exercise without signs/symptoms of physical distress.     Intensity THRR unchanged THRR unchanged THRR unchanged           Progression   Progression Continue to progress workloads to maintain intensity without signs/symptoms of physical distress. Continue to progress workloads to maintain intensity without signs/symptoms of physical distress. Continue to progress workloads to maintain intensity without signs/symptoms  of physical  distress.     Average METs 2 2.8 2.3           Resistance Training   Training Prescription Yes No No     Weight 3 lbs No weights on Wednesdays No weights on Wednesdays           Interval Training   Interval Training No No No           NuStep   Level 2 2 2      SPM 80 85 85     Minutes 15 15 15      METs 1.8 2.1 2.2           Track   Laps 11 22 8      Minutes 15 15 10      METs 2.28 3.55 2.39           Home Exercise Plan   Plans to continue exercise at -- -- Home (comment)     Frequency -- -- Add 2 additional days to program exercise sessions.     Initial Home Exercises Provided -- -- 01/10/21            Exercise Comments:  Exercise Comments    Row Name 12/11/20 1623 12/27/20 1600 01/10/21 1622       Exercise Comments Pt's firsat day of exercise in the CRP2 program. Pt tolerated thew session well with no complaints and is off to a good start. Reviewed METs. Pt is making good progress in the CRP2 program. Reviewed METs, goals and home exercise Rx. Pt voices that he does not feel as though he has made any significant gains to strength and stamina. Pt travels for his work and attends [facility] [place] on average 1-2x/week.  Encouraged patient to supplement his exercise with walking 2-3x/week for 30 minutes. Told patient that he can break walking up into 2-3 bouts of 10-15 minutes 2-3x/day. Discussed 1-2 days of exericse per week is is not adequate to make significant gains and to try to get at least 5 days/week of exercise. Pt verbalized understanding of the home exericse Rx and was provided a copy.            Exercise Goals and Review:  Exercise Goals    Row Name 12/07/20 0947             Exercise Goals   Increase Physical Activity Yes       Intervention Provide advice, education, support and counseling about physical activity/exercise needs.;Develop an individualized exercise prescription for aerobic and resistive training based on initial evaluation findings, risk  stratification, comorbidities and participant's personal goals.       Expected Outcomes Short Term: Attend rehab on a regular basis to increase amount of physical activity.;Long Term: Add in home exercise to make exercise part of routine and to increase amount of physical activity.;Long Term: Exercising regularly at least 3-5 days a week.       Increase Strength and Stamina Yes       Intervention Provide advice, education, support and counseling about physical activity/exercise needs.;Develop an individualized exercise prescription for aerobic and resistive training based on initial evaluation findings, risk stratification, comorbidities and participant's personal goals.       Expected Outcomes Short Term: Increase workloads from initial exercise prescription for resistance, speed, and METs.;Short Term: Perform resistance training exercises routinely during rehab and add in resistance training at home;Long Term: Improve cardiorespiratory fitness, muscular endurance and strength as measured by increased METs and functional capacity (6MWT)  Able to understand and use rate of perceived exertion (RPE) scale Yes       Intervention Provide education and explanation on how to use RPE scale       Expected Outcomes Short Term: Able to use RPE daily in rehab to express subjective intensity level;Long Term:  Able to use RPE to guide intensity level when exercising independently       Knowledge and understanding of Target Heart Rate Range (THRR) Yes       Intervention Provide education and explanation of THRR including how the numbers were predicted and where they are located for reference       Expected Outcomes Short Term: Able to state/look up THRR;Short Term: Able to use daily as guideline for intensity in rehab;Long Term: Able to use THRR to govern intensity when exercising independently       Understanding of Exercise Prescription Yes       Intervention Provide education, explanation, and written  materials on patient's individual exercise prescription       Expected Outcomes Short Term: Able to explain program exercise prescription;Long Term: Able to explain home exercise prescription to exercise independently              Exercise Goals Re-Evaluation :  Exercise Goals Re-Evaluation    Row Name 12/11/20 1620 01/10/21 1620           Exercise Goal Re-Evaluation   Exercise Goals Review Increase Physical Activity;Increase Strength and Stamina;Able to understand and use rate of perceived exertion (RPE) scale;Knowledge and understanding of Target Heart Rate Range (THRR);Understanding of Exercise Prescription Increase Physical Activity;Increase Strength and Stamina;Able to understand and use rate of perceived exertion (RPE) scale;Knowledge and understanding of Target Heart Rate Range (THRR);Able to check pulse independently;Understanding of Exercise Prescription      Comments Pt's first day of exercise in the CRP2 program. Pt understands the Exercise Rx, RPE scale, and THRR. Reviewed METs, Goals and home exercise Rx. Pt encouraged to incorporated walking; 30 minutes 2-3x/wk in addition to the [place] program.      Expected Outcomes Will continue to monitor tthe patient and progress exercise workloads as tolerated. Will continue to monitor tthe patient and progress exercise workloads as tolerated. Pt will begin walking 30 minutes 2-3x/week.              Discharge Exercise Prescription (Final Exercise Prescription Changes):  Exercise Prescription Changes - 01/10/21 1620      Response to Exercise   Blood Pressure (Admit) 122/64    Blood Pressure (Exercise) 130/80    Blood Pressure (Exit) 108/62    Heart Rate (Admit) 82 bpm    Heart Rate (Exercise) 100 bpm    Heart Rate (Exit) 89 bpm    Rating of Perceived Exertion (Exercise) 12    Symptoms None    Comments Reviewed METs, Goals, and home exercise Rx    Duration Continue with 30 min of aerobic exercise without signs/symptoms of physical  distress.    Intensity THRR unchanged      Progression   Progression Continue to progress workloads to maintain intensity without signs/symptoms of physical distress.    Average METs 2.3      Resistance Training   Training Prescription No    Weight No weights on Wednesdays      Interval Training   Interval Training No      NuStep   Level 2    SPM 85    Minutes 15    METs 2.2  Track   Laps 8    Minutes 10    METs 2.39      Home Exercise Plan   Plans to continue exercise at Home (comment)    Frequency Add 2 additional days to program exercise sessions.    Initial Home Exercises Provided 01/10/21           Nutrition:  Target Goals: Understanding of nutrition guidelines, daily intake of sodium <1572m, cholesterol <2047m calories 30% from fat and 7% or less from saturated fats, daily to have 5 or more servings of fruits and vegetables.  Biometrics:  Pre Biometrics - 12/07/20 0933      Pre Biometrics   Waist Circumference 48.5 inches    Hip Circumference 47 inches    Waist to Hip Ratio 1.03 %    Triceps Skinfold 29 mm    % Body Fat 37.6 %    Grip Strength 36 kg    Flexibility 7 in    Single Leg Stand 30 seconds            Nutrition Therapy Plan and Nutrition Goals:  Nutrition Therapy & Goals - 12/18/20 1536      Nutrition Therapy   Diet TLC    Drug/Food Interactions Statins/Certain Fruits      Personal Nutrition Goals   Nutrition Goal Pt to identify food quantities necessary to achieve weight loss of 6-24 lb at graduation from cardiac rehab.    Personal Goal #2 Pt to build a healthy plate including vegetables, fruits, whole grains, and low-fat dairy products in a heart healthy meal plan.      Intervention Plan   Intervention Prescribe, educate and counsel regarding individualized specific dietary modifications aiming towards targeted core components such as weight, hypertension, lipid management, diabetes, heart failure and other  comorbidities.;Nutrition handout(s) given to patient.    Expected Outcomes Short Term Goal: Understand basic principles of dietary content, such as calories, fat, sodium, cholesterol and nutrients.;Long Term Goal: Adherence to prescribed nutrition plan.           Nutrition Assessments:  MEDIFICTS Score Key:  ?70 Need to make dietary changes   40-70 Heart Healthy Diet  ? 40 Therapeutic Level Cholesterol Diet  Flowsheet Row CARDIAC [facility]HAB [facility] [facility] [place] [place]     Picture Your Plate Scores:  <4<65nhealthy dietary pattern with much room for improvement.  41-50 Dietary pattern unlikely to meet recommendations for good health and room for improvement.  51-60 More healthful dietary pattern, with some room for improvement.   >60 Healthy dietary pattern, although there may be some specific behaviors that could be improved.    Nutrition Goals Re-Evaluation:  Nutrition Goals Re-Evaluation    [place] 12/18/20 1536 01/15/21 0803           Goals   Current Weight 209 lb 3.5 oz (94.9 kg) 210 lb 15.7 oz (95.7 kg)      Nutrition Goal -- Pt to identify food quantities necessary to achieve weight loss of 6-24 lb at graduation from cardiac rehab.      Expected Outcome Weight loss Weight loss             Personal Goal #2 Re-Evaluation   Personal Goal #2 -- Pt to build a healthy plate including vegetables, fruits, whole grains, and low-fat dairy products in a heart healthy meal plan.  Nutrition Goals Discharge (Final Nutrition Goals Re-Evaluation):  Nutrition Goals Re-Evaluation - 01/15/21 0803      Goals   Current Weight 210 lb 15.7 oz (95.7 kg)    Nutrition Goal Pt to identify food quantities necessary to achieve weight loss of 6-24 lb at graduation from cardiac rehab.    Expected Outcome Weight loss      Personal Goal #2 Re-Evaluation   Personal Goal #2 Pt to  build a healthy plate including vegetables, fruits, whole grains, and low-fat dairy products in a heart healthy meal plan.           Psychosocial: Target Goals: Acknowledge presence or absence of significant depression and/or stress, maximize coping skills, provide positive support system. Participant is able to verbalize types and ability to use techniques and skills needed for reducing stress and depression.  Initial Review & Psychosocial Screening:  Initial Psych Review & Screening - 12/07/20 1047      Initial Review   Current issues with None Identified      Family Dynamics   Good Support System? 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      Screening Interventions   Interventions Encouraged to exercise           Quality of Life Scores:  Quality of Life - 12/07/20 0951      Quality of Life   Select Quality of Life      Quality of Life Scores   Health/Function Pre 26.17 %    Socioeconomic Pre 21.25 %    Psych/Spiritual Pre 25.57 %    Family Pre 28.8 %    GLOBAL Pre 25.3 %          Scores of 19 and below usually indicate a poorer quality of life in these areas.  A difference of  2-3 points is a clinically meaningful difference.  A difference of 2-3 points in the total score of the Quality of Life Index has been associated with significant improvement in overall quality of life, self-image, physical symptoms, and general health in studies assessing change in quality of life.  PHQ-9: Recent Review Flowsheet Data    Depression screen [facility] 2/9 12/07/2020   Decreased Interest 0   Down, Depressed, Hopeless 0   PHQ - 2 Score 0     Interpretation of Total Score  Total Score Depression Severity:  1-4 = Minimal depression, 5-9 = Mild depression, 10-14 = Moderate depression, 15-19 = Moderately severe depression, 20-27 = Severe depression    Psychosocial Evaluation and Intervention:   Psychosocial Re-Evaluation:  Psychosocial Re-Evaluation    [place] Name 12/12/20 1141 12/20/20 1621 01/16/21 1555         Psychosocial Re-Evaluation   Current issues with None Identified None Identified None Identified     Interventions Encouraged to attend Cardiac Rehabilitation for the exercise Encouraged to attend Cardiac Rehabilitation for the exercise Encouraged to attend Cardiac Rehabilitation for the exercise     Continue Psychosocial Services  No Follow up required No Follow up required No Follow up required            Psychosocial Discharge (Final Psychosocial Re-Evaluation):  Psychosocial Re-Evaluation - 01/16/21 1555      Psychosocial Re-Evaluation   Current issues with None Identified    Interventions Encouraged to attend Cardiac Rehabilitation for the exercise  Continue Psychosocial Services  No Follow up required           Vocational Rehabilitation: Provide vocational rehab assistance to qualifying candidates.   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 Participant will state understanding/return demonstration of topics presented.  Learning Barriers/Preferences:  Learning Barriers/Preferences - 12/07/20 0958      Learning Barriers/Preferences   Learning Barriers Sight   bilateral glaucoma   Learning Preferences None           Education Topics: Hypertension, Hypertension Reduction -Define heart disease and high blood pressure. Discus how high blood pressure affects the body and ways to reduce high blood pressure.   Exercise and Your Heart -Discuss why  it is important to exercise, the FITT principles of exercise, normal and abnormal responses to exercise, and how to exercise safely.   Angina -Discuss definition of angina, causes of angina, treatment of angina, and how to decrease risk of having angina.   Cardiac Medications -Review what the following cardiac medications are used for, how they affect the body, and side effects that may occur when taking the medications.  Medications include Aspirin, Beta blockers, calcium channel blockers, ACE Inhibitors, angiotensin receptor blockers, diuretics, digoxin, and antihyperlipidemics.   Congestive Heart Failure -Discuss the definition of CHF, how to live with CHF, the signs and symptoms of CHF, and how keep track of weight and sodium intake.   Heart Disease and Intimacy -Discus the effect sexual activity has on the heart, how changes occur during intimacy as we age, and safety during sexual activity.   Smoking Cessation / COPD -Discuss different methods to quit smoking, the health benefits of quitting smoking, and the definition of COPD.   Nutrition I: Fats -Discuss the types of cholesterol, what cholesterol does to the heart, and how cholesterol levels can be controlled.   Nutrition II: Labels -Discuss the different components of food labels and how to read food label   Heart Parts/Heart Disease and PAD -Discuss the anatomy of the heart, the pathway of blood circulation through the heart, and these are affected by heart disease.   Stress I: Signs and Symptoms -Discuss the causes of stress, how stress may lead to anxiety and depression, and ways to limit stress.   Stress II: Relaxation -Discuss different types of relaxation techniques to limit stress.   Warning Signs of Stroke / TIA -Discuss definition of a stroke, what the signs and symptoms are of a stroke, and how to identify when someone is having stroke.   Knowledge Questionnaire Score:  Knowledge Questionnaire Score  - 12/07/20 1001      Knowledge Questionnaire Score   Pre Score 18/24           Core Components/Risk Factors/Patient Goals at Admission:  Personal Goals and Risk Factors at Admission - 12/07/20 1005      Core Components/Risk Factors/Patient Goals on Admission    Weight Management Yes    Intervention Weight Management: Develop a combined nutrition and exercise program designed to reach desired caloric intake, while maintaining appropriate intake of nutrient and fiber, sodium and fats, and appropriate energy expenditure required for the  weight goal.;Weight Management: Provide education and appropriate resources to help participant work on and attain dietary goals.;Weight Management/Obesity: Establish reasonable short term and long term weight goals.;Obesity: Provide education and appropriate resources to help participant work on and attain dietary goals.    Admit Weight 212 lb 14.4 oz (96.6 kg)    Goal Weight: Long Term 185 lb (83.9 kg)    Expected Outcomes Short Term: Continue to assess and modify interventions until short term weight is achieved;Long Term: Adherence to nutrition and physical activity/exercise program aimed toward attainment of established weight goal;Weight Maintenance: Understanding of the daily nutrition guidelines, which includes 25-35% calories from fat, 7% or less cal from saturated fats, less than 268m cholesterol, less than 1.5gm of sodium, & 5 or more servings of fruits and vegetables daily;Weight Loss: Understanding of general recommendations for a balanced deficit meal plan, which promotes 1-2 lb weight loss per week and includes a negative energy balance of [phone] kcal/d;Understanding of distribution of calorie intake throughout the day with the consumption of 4-5 meals/snacks    Diabetes Yes    Intervention Provide education about signs/symptoms and action to take for hypo/hyperglycemia.;Provide education about proper nutrition, including hydration, and  aerobic/resistive exercise prescription along with prescribed medications to achieve blood glucose in normal ranges: Fasting glucose 65-99 mg/dL    Expected Outcomes Short Term: Participant verbalizes understanding of the signs/symptoms and immediate care of hyper/hypoglycemia, proper foot care and importance of medication, aerobic/resistive exercise and nutrition plan for blood glucose control.;Long Term: Attainment of HbA1C < 7%.    Hypertension Yes    Intervention Provide education on lifestyle modifcations including regular physical activity/exercise, weight management, moderate sodium restriction and increased consumption of fresh fruit, vegetables, and low fat dairy, alcohol moderation, and smoking cessation.;Monitor prescription use compliance.    Expected Outcomes Short Term: Continued assessment and intervention until BP is < 140/978mHG in hypertensive participants. < 130/8078mG in hypertensive participants with diabetes, heart failure or chronic kidney disease.;Long Term: Maintenance of blood pressure at goal levels.    Lipids Yes    Intervention Provide education and support for participant on nutrition & aerobic/resistive exercise along with prescribed medications to achieve LDL <18m101mDL >40mg55m Expected Outcomes Short Term: Participant states understanding of desired cholesterol values and is compliant with medications prescribed. Participant is following exercise prescription and nutrition guidelines.;Long Term: Cholesterol controlled with medications as prescribed, with individualized exercise RX and with personalized nutrition plan. Value goals: LDL < 18mg,60m > 40 mg.    Personal Goal Other Yes    Personal Goal Pt wants to increase strength and stamina. Be able to walk his stairs at home with ease and help prevent future bad health. Alos wants to reduce his weight to 185 lbs.    Intervention Will work with pt and monitor activity and progress as tolerated           Core  Components/Risk Factors/Patient Goals Review:   Goals and Risk Factor Review    Row Name 12/12/20 1143 12/20/20 1622 01/16/21 1555         Core Components/Risk Factors/Patient Goals Review   Personal Goals Review Weight Management/Obesity;Stress;Diabetes;Lipids;Hypertension Weight Management/Obesity;Stress;Diabetes;Lipids;Hypertension Weight Management/Obesity;Stress;Diabetes;Lipids;Hypertension     Review Dr WatkinChrissie Noaed exercise on 12/11/20 and did well with exercise Dr WatkinChrissie Noaf to a good start to exercise. Patient did go over his target dancing while listening to his music walking on the track. Explained to the patient to stay wiithin target by not doing  extra activites during exercise. 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 Vital signs have been stable.     Expected Outcomes Dr Chrissie Noa will continue to participate in phase 2 cardiac rehab for exercise, nutrition and life style modification Dr Chrissie Noa will continue to participate in phase 2 cardiac rehab for exercise, nutrition and life style modification Dr Chrissie Noa will continue to participate in phase 2 cardiac rehab for exercise, nutrition and life style modification            Core Components/Risk Factors/Patient Goals at Discharge (Final Review):   Goals and Risk Factor Review - 01/16/21 1555      Core Components/Risk Factors/Patient Goals Review   Personal Goals Review Weight Management/Obesity;Stress;Diabetes;Lipids;Hypertension    Review Dr Chrissie Noa has been doing well with exercise. Vital signs have been stable.    Expected Outcomes Dr Chrissie Noa will continue to participate in phase 2 cardiac rehab for exercise, nutrition and life style modification           ITP Comments:  ITP Comments    Row Name 12/07/20 1046 12/12/20 1004 12/20/20 1619 01/16/21 1553     ITP Comments Dr Fransico Him MD, Medical Director 30 Day ITP Review. 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 30 Day ITP Review. Dr Burke Keels has good partcipation and fair attendance in cardiac rehab. Dr Burke Keels has had some absences due to work obligations           Comments: See ITP comments.Barnet Pall, RN,BSN 01/16/2021 3:57 PM

## 2021-01-17 ENCOUNTER — Encounter (HOSPITAL_COMMUNITY): Payer: Medicare PPO

## 2021-01-18 ENCOUNTER — Telehealth (HOSPITAL_COMMUNITY): Payer: Self-pay | Admitting: Internal Medicine

## 2021-01-19 ENCOUNTER — Encounter (HOSPITAL_COMMUNITY): Payer: Medicare PPO

## 2021-01-22 ENCOUNTER — Encounter (HOSPITAL_COMMUNITY): Payer: Medicare PPO

## 2021-01-24 ENCOUNTER — Other Ambulatory Visit: Payer: Self-pay

## 2021-01-24 ENCOUNTER — Encounter (HOSPITAL_COMMUNITY)
Admission: RE | Admit: 2021-01-24 | Discharge: 2021-01-24 | Disposition: A | Discharge status: Home / Self Care | Payer: Medicare PPO | Source: Ambulatory Visit | Attending: Cardiovascular Disease | Admitting: Cardiovascular Disease

## 2021-01-24 DIAGNOSIS — Z951 Presence of aortocoronary bypass graft: Secondary | ICD-10-CM | POA: Diagnosis present

## 2021-01-26 ENCOUNTER — Encounter (HOSPITAL_COMMUNITY): Payer: Medicare PPO

## 2021-01-29 ENCOUNTER — Other Ambulatory Visit: Payer: Self-pay

## 2021-01-29 ENCOUNTER — Telehealth: Payer: Self-pay | Admitting: Cardiovascular Disease

## 2021-01-29 ENCOUNTER — Encounter (HOSPITAL_COMMUNITY)
Admission: RE | Admit: 2021-01-29 | Discharge: 2021-01-29 | Disposition: A | Discharge status: Home / Self Care | Payer: Medicare PPO | Source: Ambulatory Visit | Attending: Cardiovascular Disease | Admitting: Cardiovascular Disease

## 2021-01-29 DIAGNOSIS — Z951 Presence of aortocoronary bypass graft: Secondary | ICD-10-CM

## 2021-01-29 NOTE — Telephone Encounter (Signed)
Call placed to the patient. He was unavailable. Will call back tomorrow and wait for documents.

## 2021-01-29 NOTE — Progress Notes (Signed)
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 Patient denies shortness of breath. Upon assessment lung fields clear upon ascultation. No lower extremity edema noted. 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 Will notify Dr Tyrell Antonio office.Barnet Pall, RN,BSN 01/29/2021 4:35 PM

## 2021-01-29 NOTE — Telephone Encounter (Signed)
Pt c/o swelling: STAT is pt has developed SOB within 24 hours  1) How much weight have you gained and in what time span? 3.3 kg in a week  2) If swelling, where is the swelling located? no  3) Are you currently taking a fluid pill? yes  4) Are you currently SOB? no  5) Do you have a log of your daily weights (if so, list)? Faxing over weights  6) Have you gained 3 pounds in a day or 5 pounds in a week? 3.3 kg in a week  7) Have you traveled recently? yes   Steven [name] from Cardiac Rehab states the patient has gained 3.3 kg since May 4th. She states he is not SOB and his lungs are clear. She states his stats were at 98%. She states he went to [place] recently and "ate like a pig", but exercised and had no problems. She states she will fax over the patient's weights. She states she does not need a call back.

## 2021-01-30 NOTE — Telephone Encounter (Signed)
Call placed to the patient. His wife stated that he was at work and nothing was needed at this time. He has a follow up with Dr. Fletcher Anon on 5/17. She stated that she will tell the patient to call if anything is needed.

## 2021-01-31 ENCOUNTER — Encounter (HOSPITAL_COMMUNITY): Payer: Medicare PPO

## 2021-02-02 ENCOUNTER — Encounter (HOSPITAL_COMMUNITY)
Admission: RE | Admit: 2021-02-02 | Discharge: 2021-02-02 | Disposition: A | Discharge status: Home / Self Care | Payer: Medicare PPO | Source: Ambulatory Visit | Attending: Cardiovascular Disease | Admitting: Cardiovascular Disease

## 2021-02-05 ENCOUNTER — Encounter (HOSPITAL_COMMUNITY)
Admission: RE | Admit: 2021-02-05 | Discharge: 2021-02-05 | Disposition: A | Discharge status: Home / Self Care | Payer: Medicare PPO | Source: Ambulatory Visit | Attending: Cardiovascular Disease | Admitting: Cardiovascular Disease

## 2021-02-05 ENCOUNTER — Other Ambulatory Visit: Payer: Self-pay

## 2021-02-05 VITALS — Ht 65.5 in | Wt 209.2 lb

## 2021-02-05 DIAGNOSIS — Z951 Presence of aortocoronary bypass graft: Secondary | ICD-10-CM | POA: Diagnosis not present

## 2021-02-05 NOTE — Progress Notes (Signed)
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 Patient independent in their exercise. Patient has met program and personal goals.  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 [facility]:   Initial Exercise Prescription:  Initial Exercise Prescription - 12/07/20 0900      Date of Initial Exercise RX and Referring Provider   Date 12/07/20    Referring Provider Kathlyn Sacramento, MD    Expected Discharge Date 02/02/21      Treadmill   MPH --    Grade --    Minutes --    METs --      NuStep   Level 2    SPM 80    Minutes 15    METs 2      Track   Laps 12    Minutes 15    METs 2.39      Prescription Details   Frequency (times per week) 3    Duration Progress to 30 minutes of continuous aerobic without signs/symptoms of physical distress      Intensity   THRR 40-80% of Max Heartrate 59-118    Ratings of Perceived Exertion 11-13    Perceived Dyspnea 0-4      Progression   Progression Continue progressive overload as per policy without signs/symptoms or physical distress.      Resistance Training   Training Prescription Yes    Weight 3    Reps 10-15           Discharge Exercise Prescription (Final Exercise Prescription Changes):  Exercise Prescription Changes - 02/05/21 1615      Response to Exercise   Blood Pressure (Admit) 128/60    Blood Pressure (Exercise) 160/66    Blood Pressure (Exit) 110/70    Heart Rate (Admit) 75 bpm    Heart Rate (Exercise) 114 bpm    Heart Rate (Exit) 75  bpm    Rating of Perceived Exertion (Exercise) 8    Symptoms None    Comments Pt graduated form the CRP2 program today.    Duration Continue with 30 min of aerobic exercise without signs/symptoms of physical distress.    Intensity THRR unchanged      Progression   Progression Continue to progress workloads to maintain intensity without signs/symptoms of physical distress.    Average METs 2.75      Resistance Training   Training Prescription Yes    Weight 3 lbs    Reps 10-15      Interval Training   Interval Training No      NuStep   Level 2    SPM 90    Minutes 30    METs 2.4      Home Exercise Plan   Plans to continue exercise at Home (comment)    Frequency Add 2 additional days to program exercise sessions.    Initial Home Exercises Provided 01/10/21           Functional Capacity:  6 Minute Walk    Row Name 12/07/20  3335 02/05/21 1521       6 Minute Walk   Phase Initial Discharge    Distance 1140 feet 1640 feet    Distance % Change -- 43.86 %    Distance Feet Change -- 500 ft    Walk Time 6 minutes 6 minutes    # of Rest Breaks 0 0    MPH 2.16 3.1    METS 1.85 3.3    RPE 9 7    Perceived Dyspnea  0 0    VO2 Peak 6.47 11.57    Symptoms No No    Resting HR 75 bpm 75 bpm    Resting BP 98/72 128/60    Resting Oxygen Saturation  97 % --    Exercise Oxygen Saturation  during 6 min walk 97 % --    Max Ex. HR 93 bpm 114 bpm    Max Ex. BP 116/72 160/66    2 Minute Post BP 112/68 --           Psychological, QOL, Others - Outcomes: PHQ 2/9: Depression screen [facility] 2/9 02/05/2021 12/07/2020  Decreased Interest 0 0  Down, Depressed, Hopeless 0 0  PHQ - 2 Score 0 0    Quality of Life:  Quality of Life - 02/05/21 1600      Quality of Life   Select Quality of Life      Quality of Life Scores   Health/Function Post 26.87 %    Socioeconomic Post 28.57 %    Psych/Spiritual Post 28.07 %    Family Post 30 %    GLOBAL Post 27.93 %           Personal  Goals: Goals established at orientation with interventions provided to work toward goal.  Personal Goals and Risk Factors at Admission - 12/07/20 1005      Core Components/Risk Factors/Patient Goals on Admission    Weight Management Yes    Intervention Weight Management: Develop a combined nutrition and exercise program designed to reach desired caloric intake, while maintaining appropriate intake of nutrient and fiber, sodium and fats, and appropriate energy expenditure required for the weight goal.;Weight Management: Provide education and appropriate resources to help participant work on and attain dietary goals.;Weight Management/Obesity: Establish reasonable short term and long term weight goals.;Obesity: Provide education and appropriate resources to help participant work on and attain dietary goals.    Admit Weight 212 lb 14.4 oz (96.6 kg)    Goal Weight: Long Term 185 lb (83.9 kg)    Expected Outcomes Short Term: Continue to assess and modify interventions until short term weight is achieved;Long Term: Adherence to nutrition and physical activity/exercise program aimed toward attainment of established weight goal;Weight Maintenance: Understanding of the daily nutrition guidelines, which includes 25-35% calories from fat, 7% or less cal from saturated fats, less than 263m cholesterol, less than 1.5gm of sodium, & 5 or more servings of fruits and vegetables daily;Weight Loss: Understanding of general recommendations for a balanced deficit meal plan, which promotes 1-2 lb weight loss per week and includes a negative energy balance of [phone] kcal/d;Understanding of distribution of calorie intake throughout the day with the consumption of 4-5 meals/snacks    Diabetes Yes    Intervention Provide education about signs/symptoms and action to take for hypo/hyperglycemia.;Provide education about proper nutrition, including hydration, and aerobic/resistive exercise prescription along with prescribed  medications to achieve blood glucose in normal ranges: Fasting glucose 65-99 mg/dL    Expected Outcomes Short Term: Participant verbalizes understanding  of the signs/symptoms and immediate care of hyper/hypoglycemia, proper foot care and importance of medication, aerobic/resistive exercise and nutrition plan for blood glucose control.;Long Term: Attainment of HbA1C < 7%.    Hypertension Yes    Intervention Provide education on lifestyle modifcations including regular physical activity/exercise, weight management, moderate sodium restriction and increased consumption of fresh fruit, vegetables, and low fat dairy, alcohol moderation, and smoking cessation.;Monitor prescription use compliance.    Expected Outcomes Short Term: Continued assessment and intervention until BP is < 140/88m HG in hypertensive participants. < 130/855mHG in hypertensive participants with diabetes, heart failure or chronic kidney disease.;Long Term: Maintenance of blood pressure at goal levels.    Lipids Yes    Intervention Provide education and support for participant on nutrition & aerobic/resistive exercise along with prescribed medications to achieve LDL <7071mHDL >66m69m  Expected Outcomes Short Term: Participant states understanding of desired cholesterol values and is compliant with medications prescribed. Participant is following exercise prescription and nutrition guidelines.;Long Term: Cholesterol controlled with medications as prescribed, with individualized exercise RX and with personalized nutrition plan. Value goals: LDL < 70mg50mL > 40 mg.    Personal Goal Other Yes    Personal Goal Pt wants to increase strength and stamina. Be able to walk his stairs at home with ease and help prevent future bad health. Alos wants to reduce his weight to 185 lbs.    Intervention Will work with pt and monitor activity and progress as tolerated            Personal Goals Discharge:  Goals and Risk Factor Review    Row Name  12/12/20 1143 12/20/20 1622 01/16/21 1555 02/27/21 0934       Core Components/Risk Factors/Patient Goals Review   Personal Goals Review Weight Management/Obesity;Stress;Diabetes;Lipids;Hypertension Weight Management/Obesity;Stress;Diabetes;Lipids;Hypertension Weight Management/Obesity;Stress;Diabetes;Lipids;Hypertension Weight Management/Obesity;Stress;Diabetes;Lipids;Hypertension    Review Dr WatkiChrissie Noated exercise on 12/11/20 and did well with exercise Dr WatkiChrissie Noaff to a good start to exercise. Patient did go over his target dancing while listening to his music walking on the track. Explained to the patient to stay wiithin target by not doing extra activites during exercise. Dr WatkiChrissie Noabeen doing well with exercise. Vital signs have been stable. Dr WatkiChrissie Noawell with exercise. Dr WatkiChrissie Noaleted cardiac rehab on 02/05/21    Expected Outcomes Dr WatkiChrissie Noa continue to participate in phase 2 cardiac rehab for exercise, nutrition and life style modification Dr WatkiChrissie Noa continue to participate in phase 2 cardiac rehab for exercise, nutrition and life style modification Dr WatkiChrissie Noa continue to participate in phase 2 cardiac rehab for exercise, nutrition and life style modification Dr WatkiChrissie Noa continue to  exercise,  follow nutrition and life style modification upon completion of phase 2 cardiac rehab.           Exercise Goals and Review:  Exercise Goals    Row Name 12/07/20 0947             Exercise Goals   Increase Physical Activity Yes       Intervention Provide advice, education, support and counseling about physical activity/exercise needs.;Develop an individualized exercise prescription for aerobic and resistive training based on initial evaluation findings, risk stratification, comorbidities and participant's personal goals.       Expected Outcomes Short Term: Attend rehab on a regular basis to increase amount of physical activity.;Long Term: Add in home exercise  to make exercise part of routine and to increase amount of physical activity.;Long Term:  Exercising regularly at least 3-5 days a week.       Increase Strength and Stamina Yes       Intervention Provide advice, education, support and counseling about physical activity/exercise needs.;Develop an individualized exercise prescription for aerobic and resistive training based on initial evaluation findings, risk stratification, comorbidities and participant's personal goals.       Expected Outcomes Short Term: Increase workloads from initial exercise prescription for resistance, speed, and METs.;Short Term: Perform resistance training exercises routinely during rehab and add in resistance training at home;Long Term: Improve cardiorespiratory fitness, muscular endurance and strength as measured by increased METs and functional capacity (6MWT)       Able to understand and use rate of perceived exertion (RPE) scale Yes       Intervention Provide education and explanation on how to use RPE scale       Expected Outcomes Short Term: Able to use RPE daily in rehab to express subjective intensity level;Long Term:  Able to use RPE to guide intensity level when exercising independently       Knowledge and understanding of Target Heart Rate Range (THRR) Yes       Intervention Provide education and explanation of THRR including how the numbers were predicted and where they are located for reference       Expected Outcomes Short Term: Able to state/look up THRR;Short Term: Able to use daily as guideline for intensity in rehab;Long Term: Able to use THRR to govern intensity when exercising independently       Understanding of Exercise Prescription Yes       Intervention Provide education, explanation, and written materials on patient's individual exercise prescription       Expected Outcomes Short Term: Able to explain program exercise prescription;Long Term: Able to explain home exercise prescription to exercise  independently              Exercise Goals Re-Evaluation:  Exercise Goals Re-Evaluation    Row Name 12/11/20 1620 01/10/21 1620 02/05/21 1630         Exercise Goal Re-Evaluation   Exercise Goals Review Increase Physical Activity;Increase Strength and Stamina;Able to understand and use rate of perceived exertion (RPE) scale;Knowledge and understanding of Target Heart Rate Range (THRR);Understanding of Exercise Prescription Increase Physical Activity;Increase Strength and Stamina;Able to understand and use rate of perceived exertion (RPE) scale;Knowledge and understanding of Target Heart Rate Range (THRR);Able to check pulse independently;Understanding of Exercise Prescription Increase Physical Activity;Increase Strength and Stamina;Able to understand and use rate of perceived exertion (RPE) scale;Knowledge and understanding of Target Heart Rate Range (THRR);Able to check pulse independently;Understanding of Exercise Prescription     Comments Pt's first day of exercise in the CRP2 program. Pt understands the Exercise Rx, RPE scale, and THRR. Reviewed METs, Goals and home exercise Rx. Pt encouraged to incorporated walking; 30 minutes 2-3x/wk in addition to the CRP2 program. Pt graduated form the CRP2 program. Pt had an average MET level of 2.75. Pt voiced feeling that he had improved his CV fitness and endurance since begin the program. Pt voices he will continue to walk at home for 30 minutes daily (10 minute intervals) and ride his bike for 30 minutes during the news.     Expected Outcomes Will continue to monitor tthe patient and progress exercise workloads as tolerated. Will continue to monitor tthe patient and progress exercise workloads as tolerated. Pt will begin walking 30 minutes 2-3x/week. Pt will continue to exercise at home on his own.  Nutrition & Weight - Outcomes:  Pre Biometrics - 12/07/20 0933      Pre Biometrics   Waist Circumference 48.5 inches    Hip Circumference  47 inches    Waist to Hip Ratio 1.03 %    Triceps Skinfold 29 mm    % Body Fat 37.6 %    Grip Strength 36 kg    Flexibility 7 in    Single Leg Stand 30 seconds           Post Biometrics - 02/05/21 1620       Post  Biometrics   Height 5' 5.5" (1.664 m)    Weight 94.9 kg    Waist Circumference 48 inches    Hip Circumference 47 inches    Waist to Hip Ratio 1.02 %    BMI (Calculated) 34.27    Triceps Skinfold 26 mm    % Body Fat 34.3 %    Grip Strength 40 kg    Flexibility 0 in    Single Leg Stand 26.35 seconds           Nutrition:  Nutrition Therapy & Goals - 12/18/20 1536      Nutrition Therapy   Diet TLC    Drug/Food Interactions Statins/Certain Fruits      Personal Nutrition Goals   Nutrition Goal Pt to identify food quantities necessary to achieve weight loss of 6-24 lb at graduation from cardiac rehab.    Personal Goal #2 Pt to build a healthy plate including vegetables, fruits, whole grains, and low-fat dairy products in a heart healthy meal plan.      Intervention Plan   Intervention Prescribe, educate and counsel regarding individualized specific dietary modifications aiming towards targeted core components such as weight, hypertension, lipid management, diabetes, heart failure and other comorbidities.;Nutrition handout(s) given to patient.    Expected Outcomes Short Term Goal: Understand basic principles of dietary content, such as calories, fat, sodium, cholesterol and nutrients.;Long Term Goal: Adherence to prescribed nutrition plan.           Nutrition Discharge:   Education Questionnaire Score:  Knowledge Questionnaire Score - 02/05/21 1600      Knowledge Questionnaire Score   Post Score 21/24           Goals reviewed with patient; copy given to patient.Pt graduated from cardiac rehab program on 02/05/21 with completion of 13 exercise sessions in Phase II. Pt maintained fair attendance and progressed nicely during his participation in rehab as  evidenced by increased MET level.   Medication list reconciled. Repeat  PHQ score-  0.  Pt has made significant lifestyle changes and should be commended for his success. Pt feels he has achieved his goals during cardiac rehab.   Pt plans to continue exercise by riding his exercise bike and walking some. 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 We are proud of Dr Chrissie Noa progress. Barnet Pall, RN,BSN 02/27/2021 9:38 AM

## 2021-02-06 ENCOUNTER — Encounter: Payer: Self-pay | Admitting: Cardiovascular Disease

## 2021-02-06 ENCOUNTER — Ambulatory Visit: Payer: Medicare PPO | Admitting: Cardiovascular Disease

## 2021-02-06 VITALS — BP 128/68 | HR 87 | Ht 66.0 in | Wt 213.6 lb

## 2021-02-06 DIAGNOSIS — I2510 Atherosclerotic heart disease of native coronary artery without angina pectoris: Secondary | ICD-10-CM

## 2021-02-06 DIAGNOSIS — I10 Essential (primary) hypertension: Secondary | ICD-10-CM | POA: Diagnosis not present

## 2021-02-06 DIAGNOSIS — E785 Hyperlipidemia, unspecified: Secondary | ICD-10-CM | POA: Diagnosis not present

## 2021-02-06 DIAGNOSIS — I480 Paroxysmal atrial fibrillation: Secondary | ICD-10-CM

## 2021-02-06 NOTE — Patient Instructions (Signed)
Medication Instructions:  No changes *If you need a refill on your cardiac medications before your next appointment, please call your pharmacy*   Lab Work: Your provider would like for you to return in a few weeks to have the following labs drawn: CMET, CBC and fasting Lipid. You do not need an appointment for the lab. Once in our office lobby there is a podium where you can sign in and ring the doorbell to alert [place] that you are here. The lab is open from 8:00 am to 4:30 pm; closed for lunch from 12:45pm-1:45pm.  If you have labs (blood work) drawn today and your tests are completely normal, you will receive your results only by: Marland Kitchen MyChart Message (if you have MyChart) OR . A paper copy in the mail If you have any lab test that is abnormal or we need to change your treatment, we will call you to review the results.   Testing/Procedures: None ordered   Follow-Up: At [facility], you and your health needs are our priority.  As part of our continuing mission to provide you with exceptional heart care, we have created designated Provider Care Teams.  These Care Teams include your primary Cardiologist (physician) and Advanced Practice Providers (APPs -  Physician Assistants and Nurse Practitioners) who all work together to provide you with the care you need, when you need it.  We recommend signing up for the patient portal called "MyChart".  Sign up information is provided on this After Visit Summary.  MyChart is used to connect with patients for Virtual Visits (Telemedicine).  Patients are able to view lab/test results, encounter notes, upcoming appointments, etc.  Non-urgent messages can be sent to your provider as well.   To learn more about what you can do with MyChart, go to [email or web address].    Your next appointment:   6 month(s)  The format for your next appointment:   In Person  Provider:   Kathlyn Sacramento, MD

## 2021-02-06 NOTE — Progress Notes (Signed)
Cardiology Office Note   Date:  02/06/2021   ID:  Steven Bury MD, DOB [birth year], MRN [id number]  PCP:  Audley Hose, MD  Cardiologist: Stark Klein chief complaint on file.     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 male who presents for a follow-up visit regarding paroxysmal atrial fibrillation and coronary artery disease status post recent CABG.  He has been followed for paroxysmal atrial fibrillation. He moved from [place] in 2018. He is a retired OB/GYN MD. He has chronic medical conditions that include prostate cancer, essential hypertension, DM, previous TIA, hyperlipidemia and obesity. He was noted to have significant worsening of exertional dyspnea during his office follow-up visit in September.  Thus, he underwent cardiac CTA which showed severely elevated calcium score at 2967 which was 30 percentile for age and sex matched control.  He was noted to have multivessel calcified disease including left main greater than 50%, moderate LAD disease, moderate RCA disease and moderate left circumflex disease.  I recommended proceeding with left heart catheterization and the patient's preference was to have it done at [facility] and thus I referred him to Dr. Ronnald Ramp.  Cardiac catheterization was performed and confirmed significant diffuse three-vessel coronary artery disease.  Left main disease was not significant, there was 80% diffuse calcified mid LAD disease, 60% ostial left circumflex disease and 70% stenosis in OM 2 and OM 3.  The RCA was occluded in the midsegment.  The patient underwent CABG in November by Dr. Norm Parcel with LIMA to LAD, radial to OM 2, SVG to OM 3 and distal RCA.  In addition, he underwent pulmonary vein ablation and left atrial appendage clipping.  He had postoperative bradycardia that necessitated decreasing the dose of metoprolol.  He attended cardiac rehab and has been doing well with no recent chest pain, shortness of breath or  palpitations.    Past Medical History:  Diagnosis Date  . Cancer ([place])   . Carcinoma in situ of prostate   . Coronary artery disease   . Diabetes mellitus without complication ([place])   . History of transient ischemic attack (TIA)   . Hyperlipidemia   . Hypertension   . Ischemia   . Obesity   . PAF (paroxysmal atrial fibrillation) ([place])   . Palpitations   . Prostate CA ([place])   . SOB (shortness of breath)     Past Surgical History:  Procedure Laterality Date  . CARDIAC CATHETERIZATION  06/2020   At [facility]: Three-vessel coronary artery disease with 80% calcified diffuse mid LAD disease, 60% ostial left circumflex, 70% stenosis in OM 3 and OM 2 and occluded mid RCA  . CORONARY ARTERY BYPASS GRAFT  07/2020    CABG by Dr. Norm Parcel with LIMA to LAD, radial to OM 2, SVG to OM 3 and distal RCA.  In addition, he underwent pulmonary vein ablation and left atrial appendage clipping.  . IR CATHETER TUBE CHANGE  01/13/2018  . PROSTATE SURGERY  10/2016   Seeds implanted     Current Outpatient Medications  Medication Sig Dispense Refill  . amLODipine (NORVASC) 5 MG tablet Take 5 mg by mouth daily.    Marland Kitchen atorvastatin (LIPITOR) 20 MG tablet Take 40 mg by mouth daily.    . furosemide (LASIX) 40 MG tablet Take 1 tablet (40 mg total) by mouth daily. 30 tablet 11  . metFORMIN (GLUCOPHAGE) 500 MG tablet Take 500 mg by mouth 2 (two) times daily with a meal.    .  metoprolol tartrate (LOPRESSOR) 25 MG tablet Take 12.5 mg by mouth 2 (two) times daily.    . potassium chloride SA (KLOR-CON) 20 MEQ tablet Take 20 mEq by mouth daily.    . rivaroxaban (XARELTO) 20 MG TABS tablet Take 1 tablet (20 mg total) by mouth daily with supper. 30 tablet 5   No current facility-administered medications for this visit.    Allergies:   Patient has no known allergies.    Social History:  The patient  reports that he has never smoked. He has never used smokeless tobacco. He reports current alcohol use. He reports that he  does not use drugs.   Family History:  The patient's family history includes Breast cancer in his mother; Diabetes in his father; Stroke in his father.    ROS:  Please see the history of present illness.   Otherwise, review of systems are positive for none.   All other systems are reviewed and negative.    PHYSICAL EXAM: VS:  BP 128/68 (BP Location: Left Arm, Patient Position: Sitting, Cuff Size: Normal)   Pulse 87   Ht 5\' 6"  (1.676 m)   Wt 213 lb 9.6 oz (96.9 kg)   BMI 34.48 kg/m  , BMI Body mass index is 34.48 kg/m. GEN: Well nourished, well developed, in no acute distress  HEENT: normal  Neck: no JVD, carotid bruits, or masses Cardiac: RRR; no murmurs, rubs, or gallops,no edema  Respiratory:  clear to auscultation bilaterally, normal work of breathing GI: soft, nontender, nondistended, + BS MS: no deformity or atrophy  Skin: warm and dry, no rash Neuro:  Strength and sensation are intact Psych: euthymic mood, full affect   EKG:  EKG is ordered today. The ekg ordered today demonstrates sinus rhythm with first-degree AV block.  Poor R wave progression in the anterior leads.   Recent Labs: No results found for requested labs within last 8760 hours.    Lipid Panel No results found for: CHOL, TRIG, HDL, CHOLHDL, VLDL, LDLCALC, LDLDIRECT    Wt Readings from Last 3 Encounters:  02/06/21 213 lb 9.6 oz (96.9 kg)  02/05/21 209 lb 3.5 oz (94.9 kg)  01/15/21 210 lb 15.7 oz (95.7 kg)       PAD Screen 05/06/2017  Previous PAD dx? No  Previous surgical procedure? No  Pain with walking? No  Feet/toe relief with dangling? No  Painful, non-healing ulcers? No  Extremities discolored? No      ASSESSMENT AND PLAN:  1.  Coronary artery disease involving native coronary arteries: status post CABG. he is doing well overall with no anginal symptoms.  Continue medical therapy.   2. Paroxysmal atrial fibrillation/flutter: He did have pulmonary pain ablation and clipping of the  left atrial appendage during his recent CABG.  Continue long-term anticoagulation with Xarelto.  Continue metoprolol.  I will have to review the data to see if it is safe to discontinue anticoagulation at some point given that he had surgical left atrial appendage clipping.  I requested routine labs.  3. Essential hypertension: Blood pressures controlled on current medication.  4. Hyperlipidemia: Currently on atorvastatin 20 mg daily.  I requested a follow-up lipid and liver profile.    Disposition:   FU with me in 6 months   Signed,  Kathlyn Sacramento, MD  02/06/2021 4:28 PM    [facility]

## 2021-03-05 LAB — LIPID PANEL
Chol/HDL Ratio: 2.4 ratio (ref 0.0–5.0)
Cholesterol, Total: 163 mg/dL (ref 100–199)
HDL: 67 mg/dL (ref >39)
LDL Chol Calc (NIH): 81 mg/dL (ref 0–99)
Triglycerides: 79 mg/dL (ref 0–149)
VLDL Cholesterol Cal: 15 mg/dL (ref 5–40)

## 2021-03-05 LAB — COMPREHENSIVE METABOLIC PANEL
ALT: 13 IU/L (ref 0–44)
AST: 16 IU/L (ref 0–40)
Albumin/Globulin Ratio: 1.6 (ref 1.2–2.2)
Albumin: 4.4 g/dL (ref 3.7–4.7)
Alkaline Phosphatase: 131 IU/L — ABNORMAL HIGH (ref 44–121)
BUN/Creatinine Ratio: 16 (ref 10–24)
BUN: 15 mg/dL (ref 8–27)
Bilirubin Total: 0.7 mg/dL (ref 0.0–1.2)
CO2: 24 mmol/L (ref 20–29)
Calcium: 9 mg/dL (ref 8.6–10.2)
Chloride: 100 mmol/L (ref 96–106)
Creatinine, Ser: 0.93 mg/dL (ref 0.76–1.27)
Globulin, Total: 2.7 g/dL (ref 1.5–4.5)
Glucose: 123 mg/dL — ABNORMAL HIGH (ref 65–99)
Potassium: 4.3 mmol/L (ref 3.5–5.2)
Sodium: 143 mmol/L (ref 134–144)
Total Protein: 7.1 g/dL (ref 6.0–8.5)
eGFR: 87 mL/min/{1.73_m2} (ref >59)

## 2021-03-05 LAB — CBC
Hematocrit: 40.2 % (ref 37.5–51.0)
Hemoglobin: 13.4 g/dL (ref 13.0–17.7)
MCH: 29.5 pg (ref 26.6–33.0)
MCHC: 33.3 g/dL (ref 31.5–35.7)
MCV: 89 fL (ref 79–97)
Platelets: 215 10*3/uL (ref 150–450)
RBC: 4.54 x10E6/uL (ref 4.14–5.80)
RDW: 11.9 % (ref 11.6–15.4)
WBC: 8.6 10*3/uL (ref 3.4–10.8)

## 2021-03-07 ENCOUNTER — Other Ambulatory Visit: Payer: Self-pay

## 2021-03-07 DIAGNOSIS — Z79899 Other long term (current) drug therapy: Secondary | ICD-10-CM

## 2021-03-07 MED ORDER — ROSUVASTATIN CALCIUM 40 MG PO TABS: 40.0000 mg | ORAL_TABLET | Freq: Every day | ORAL | 3 refills | Status: AC

## 2021-03-09 ENCOUNTER — Telehealth: Payer: Self-pay | Admitting: *Deleted

## 2021-03-09 NOTE — Telephone Encounter (Signed)
   [facility] Pre-operative Risk Assessment    Patient Name: Steven DEVINS MD  DOB: Sep 07, 1946  MRN: [id number]   Request for surgical clearance:  What type of surgery is being performed? colonoscopy   When is this surgery scheduled? 07/06/21   What type of clearance is required (medical clearance vs. Pharmacy clearance to hold med vs. Both)? pharmacy  Are there any medications that need to be held prior to surgery and how long? Xarelto    Practice name and name of physician performing surgery? [facility] Dr. Michail Sermon   What is the office phone number? [phone]   7.   What is the office fax number? [phone]  8.   Anesthesia type (None, local, MAC, general) ? propofol   Esther Broyles A Ramonia Mcclaran 03/09/2021, 4:41 PM  _________________________________________________________________   (provider comments below)

## 2021-03-12 NOTE — Telephone Encounter (Signed)
Patient with diagnosis of atrial fibrillation on Xarelto for anticoagulation.    Procedure: colonoscopy Date of procedure: 07/06/21  Procedure is still 4 months away.  Please reach out to pharmacy for clearance within 60 days of scheduled procedure.

## 2021-03-12 NOTE — Telephone Encounter (Signed)
Pharmacy can you please comment on how long Xarelto can be held for upcoming procedure?  Thank you!

## 2021-03-13 NOTE — Telephone Encounter (Signed)
Procedure is not scheduled until 07/06/2021. Therefore, we will need to reach out to pharmacy closer to time of surgery (05/2021). Of note, this is just a pharmacy request not a request for medical clearance.  Darreld Mclean, PA-C 03/13/2021 7:13 AM

## 2021-04-05 NOTE — Telephone Encounter (Signed)
    Patient Name: Steven LASTER MD  DOB: 08-26-1946 MRN: [id number]  Primary Cardiologist: Kathlyn Sacramento, MD  Chart reviewed as part of pre-operative protocol coverage.   Per pharmacy recommendations, requesting office should resubmit preop request closer tot he time of the procedure.  Callback: please notify the requesting office that they should resubmit this request in 1-2 months.   I will remove from the preop pool at this time.   Abigail Butts, PA-C 04/05/2021, 10:30 AM

## 2021-04-05 NOTE — Telephone Encounter (Signed)
I will send FYI to requesting office see notes from Pharm-D and pre op provider. Will need clearance request to be re-faxed to our office in 05/2021 so that we will be able to better assess the pt and medication recommendations.

## 2021-05-24 ENCOUNTER — Telehealth: Payer: Self-pay | Admitting: *Deleted

## 2021-05-24 NOTE — Telephone Encounter (Signed)
Pharmacy, can you please comment on how long Xarelto can be held for upcoming colonoscopy?  Thank you!

## 2021-05-24 NOTE — Telephone Encounter (Signed)
   [facility] Pre-operative Risk Assessment    Patient Name: Steven ZELEK MD  DOB: 1945-12-12 MRN: [id number]  HEARTCARE STAFF:  - IMPORTANT!!!!!! Under Visit Info/Reason for Call, type in Other and utilize the format Clearance MM/DD/YY or Clearance TBD. Do not use dashes or single digits. - Please review there is not already an duplicate clearance open for this procedure. - If request is for dental extraction, please clarify the # of teeth to be extracted. - If the patient is currently at the dentist's office, call Pre-Op Callback Staff (MA/nurse) to input urgent request.  - If the patient is not currently in the dentist office, please route to the Pre-Op pool.  Request for surgical clearance:  What type of surgery is being performed?  COLONOSCOPY  When is this surgery scheduled?  07/06/21  What type of clearance is required (medical clearance vs. Pharmacy clearance to hold med vs. Both)?  BOTH  Are there any medications that need to be held prior to surgery and how long?  [facility]  Practice name and name of physician performing surgery?  Steven [name] / Steven [name]  What is the office phone number?  [phone]   7.   What is the office fax number?  [phone]  8.   Anesthesia type (None, local, MAC, general) ?  PROPOFOL    Steven [name] 05/24/2021, 6:56 AM  _________________________________________________________________   (provider comments below)

## 2021-05-25 NOTE — Telephone Encounter (Signed)
Patient with diagnosis of atrial fibrillation on Xarelto for anticoagulation.    Procedure: colonoscopy Date of procedure: 07/06/21   CHA2DS2-VASc Score = 5  This indicates a 7.2% annual risk of stroke. The patient's score is based upon: CHF History: No HTN History: Yes Diabetes History: No Stroke History: Yes Vascular Disease History: Yes Age Score: 1 Gender Score: 0    CrCl 76 mL/min using adjusted body weight Platelet count 215K  Due to patient's TIA history will route to Dr. Fletcher Anon for input on Xarelto hold

## 2021-05-25 NOTE — Telephone Encounter (Signed)
   Primary Cardiologist: Kathlyn Sacramento, MD  Chart reviewed as part of pre-operative protocol coverage. Given past medical history and time since last visit, based on ACC/AHA guidelines, [name] MD would be at acceptable risk for the planned procedure without further cardiovascular testing.   Patient with diagnosis of atrial fibrillation on Xarelto for anticoagulation.     Procedure: colonoscopy Date of procedure: 07/06/21     CHA2DS2-VASc Score = 5  This indicates a 7.2% annual risk of stroke. The patient's score is based upon: CHF History: No HTN History: Yes Diabetes History: No Stroke History: Yes Vascular Disease History: Yes Age Score: 1 Gender Score: 0      CrCl 76 mL/min using adjusted body weight Platelet count 215K   Due to patient's TIA history his Xarelto may be held for 2 days prior to his procedure.  He does not need Lovenox bridging.  Please resume Xarelto as soon as hemostasis is achieved.  I will route this recommendation to the requesting party via Epic fax function and remove from pre-op pool.  Please call with questions.  Jossie Ng. Azelie Noguera NP-C    05/25/2021, 11:10 AM [place] [address] Office [phone] Fax [phone]

## 2021-05-25 NOTE — Telephone Encounter (Signed)
Hold Xarelto 2 days before the procedure.  No need to bridge.

## 2021-06-21 ENCOUNTER — Other Ambulatory Visit: Payer: Self-pay | Admitting: Internal Medicine

## 2021-07-27 ENCOUNTER — Ambulatory Visit: Payer: Medicare PPO | Attending: Internal Medicine

## 2021-08-24 ENCOUNTER — Ambulatory Visit: Payer: Medicare PPO | Attending: Internal Medicine

## 2021-08-24 ENCOUNTER — Other Ambulatory Visit: Payer: Self-pay

## 2021-08-24 DIAGNOSIS — M6281 Muscle weakness (generalized): Secondary | ICD-10-CM | POA: Diagnosis present

## 2021-08-24 DIAGNOSIS — R2689 Other abnormalities of gait and mobility: Secondary | ICD-10-CM | POA: Diagnosis present

## 2021-08-24 NOTE — Therapy (Signed)
[facility] [address] [place], [place], [address] Phone: [phone]   Fax:  [phone]  Physical Therapy Evaluation  Patient Details  Name: Steven BELSKY MD MRN: [id number] Date of Birth: 18-Dec-1945 Referring Provider (PT): Dr. Maia Petties   Encounter Date: 08/24/2021   PT End of Session - 08/24/21 1418     Visit Number 1    Number of Visits 4    Date for PT Re-Evaluation 09/21/21    Authorization Type [employer]    PT Start Time 40    PT Stop Time 1320    PT Time Calculation (min) 40 min    Equipment Utilized During Treatment Gait belt    Activity Tolerance Patient tolerated treatment well    Behavior During Therapy WFL for tasks assessed/performed             Past Medical History:  Diagnosis Date   Cancer ([place])    Carcinoma in situ of prostate    Coronary artery disease    Diabetes mellitus without complication ([place])    History of transient ischemic attack (TIA)    Hyperlipidemia    Hypertension    Ischemia    Obesity    PAF (paroxysmal atrial fibrillation) ([place])    Palpitations    Prostate CA ([place])    SOB (shortness of breath)     Past Surgical History:  Procedure Laterality Date   CARDIAC CATHETERIZATION  06/2020   At [facility]: Three-vessel coronary artery disease with 80% calcified diffuse mid LAD disease, 60% ostial left circumflex, 70% stenosis in OM 3 and OM 2 and occluded mid RCA   CORONARY ARTERY BYPASS GRAFT  07/2020    CABG by Dr. Norm Parcel with LIMA to LAD, radial to OM 2, SVG to OM 3 and distal RCA.  In addition, he underwent pulmonary vein ablation and left atrial appendage clipping.   IR CATHETER TUBE CHANGE  01/13/2018   PROSTATE SURGERY  10/2016   Seeds implanted    There were no vitals filed for this visit.    Subjective Assessment - 08/24/21 1413     Subjective Pt reports he feels his knees are not as strong when going up and down the steps or curb steps. Pt reports no falls,  no pains in his knees. Pt is not walking with any AD.    Pertinent History PMH: CAD, A fib, CABG, prostate cancer, essential hypertension, DM, previous TIA, hyperlipidemia and obesity.    How long can you sit comfortably? no problem    How long can you stand comfortably? no issues    How long can you walk comfortably? no issues    Patient Stated Goals improve strength of LE and core and balance    Currently in Pain? No/denies                [facility] PT Assessment - 08/24/21 1243       Assessment   Medical Diagnosis Gait abnormality    Referring Provider (PT) Dr. 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     Cognition   Overall Cognitive Status Within Functional Limits for tasks assessed      Standardized Balance Assessment   Standardized Balance Assessment Five Times Sit to Stand    Five times sit to stand comments  17   no HHA, pt was rocking for momentum     High Level Balance   High Level Balance Comments Modified CTSIB: 120/120 sec with moderate sway with position 4. Single leg stance: 10sec bil      Functional Gait  Assessment   Gait assessed  Yes    Gait Level Surface Walks 20 ft in less than 5.5 sec, no assistive devices, good speed, no evidence for imbalance, normal gait pattern, deviates no more than 6 in outside of the 12 in walkway width.     Change in Gait Speed Able to smoothly change walking speed without loss of balance or gait deviation. Deviate no more than 6 in outside of the 12 in walkway width.    Gait with Horizontal Head Turns Performs head turns smoothly with no change in gait. Deviates no more than 6 in outside 12 in walkway width    Gait with Vertical Head Turns Performs head turns with no change in gait. Deviates no more than 6 in outside 12 in walkway width.    Gait and Pivot Turn Pivot turns safely within 3 sec and stops quickly with no loss of balance.    Step Over Obstacle Is able to step over 2 stacked shoe boxes taped together (9 in total height) without changing gait speed. No evidence of imbalance.    Gait with Narrow Base of Support Is able to ambulate for 10 steps heel to toe with no staggering.    Gait with Eyes Closed Walks 20 ft, no assistive devices, good speed, no evidence of imbalance, normal gait pattern, deviates no more than 6 in outside 12 in walkway width. Ambulates 20 ft in less than 7 sec.    Ambulating Backwards Walks 20 ft, no assistive devices, good speed, no evidence for imbalance, normal gait    Steps Alternating feet, must use rail.    Total Score 29               Practiced sit to stand: educated on nose over toes for improved mechanics  5 x 5 sit to stand, last 2 sets with focusing on "quickly standing up" Practiced stairs: 3 x 4 steps with cues to have ball of foot at edge of the step to allow for foot to pivot and reduce strain on the knees. Practiced curb step: 2 x with cues for having ball of foot at edge of the step to allow it to pivot         Objective measurements completed on examination: See above findings.                PT Education - 08/24/21 1414     Education Details PT educated on evaluation findings and HEP issued    Person(s) Educated Patient    Methods Explanation;Demonstration    Comprehension Verbalized understanding  PT Short Term Goals - 08/24/21 1415       PT SHORT TERM GOAL #1   Title STG=LTG               PT Long Term Goals - 08/24/21 1415       PT LONG TERM GOAL #1   Title Pt will be able to perform 5x sit to stand in <14 seconds to improve functional strength    Baseline 17 sec (eval)    Time 4    Period Weeks    Status New    Target Date 09/21/21                    Plan - 08/24/21 1310     Clinical Impression Statement Patient is a 75 y.o. male who was seen today for physical therapy evaluation and treatment for gait and mobility disorder. Patient has mild weakness in functional strength with sit to stand (5x sit to stand). patient's static and dynamic balance are within functional limits (Modified CTSIB, Functional Gait Assessment and Single leg stance test). patient will benefit from skilled PT to issue home exercise program and update it for independent management of his strength and balance.    Personal Factors and Comorbidities Comorbidity 3+    Comorbidities PMH: CAD, A fib, CABG, prostate cancer, essential hypertension, DM, previous TIA, hyperlipidemia and obesity.    Examination-Activity Limitations Stairs;Squat    Examination-Participation Restrictions Yard Work;Cleaning    Stability/Clinical Decision Making Stable/Uncomplicated    Clinical Decision Making Low    Rehab Potential Excellent    PT Frequency 1x / week    PT Duration 4 weeks    PT Treatment/Interventions ADLs/Self Care Home Management;Cryotherapy;Moist Heat;Balance training;Therapeutic exercise;Therapeutic activities;Functional mobility training;Stair training;Gait training;Neuromuscular re-education;Patient/family education;Manual techniques;Energy conservation;Passive range of motion;Joint Manipulations    PT Next Visit Plan Reassess 5x sit to stand, review goals    PT Home Exercise Plan Access Code [facility]    Consulted and Agree with Plan of Care Patient             Patient will benefit  from skilled therapeutic intervention in order to improve the following deficits and impairments:  Decreased endurance, Decreased strength, Improper body mechanics, Postural dysfunction, Decreased balance, Cardiopulmonary status limiting activity  Visit Diagnosis: Other abnormalities of gait and mobility  Muscle weakness (generalized)     Problem List There are no problems to display for this patient.   Kerrie Pleasure, PT 08/24/2021, 2:22 PM  [facility] [address] [place], [place], [address] Phone: [phone]   Fax:  [phone]  Name: LORENZO ARSCOTT MD MRN: [id number] Date of Birth: [birth year]

## 2021-09-28 ENCOUNTER — Ambulatory Visit: Payer: Medicare PPO

## 2021-09-29 ENCOUNTER — Encounter: Payer: Self-pay | Admitting: Cardiovascular Disease

## 2022-02-26 ENCOUNTER — Ambulatory Visit: Payer: Medicare PPO | Admitting: Cardiovascular Disease

## 2022-02-26 ENCOUNTER — Encounter: Payer: Self-pay | Admitting: Cardiovascular Disease

## 2022-02-26 VITALS — BP 128/76 | HR 60 | Ht 66.0 in | Wt 212.4 lb

## 2022-02-26 DIAGNOSIS — I480 Paroxysmal atrial fibrillation: Secondary | ICD-10-CM

## 2022-02-26 DIAGNOSIS — I2510 Atherosclerotic heart disease of native coronary artery without angina pectoris: Secondary | ICD-10-CM

## 2022-02-26 DIAGNOSIS — E785 Hyperlipidemia, unspecified: Secondary | ICD-10-CM | POA: Diagnosis not present

## 2022-02-26 DIAGNOSIS — I10 Essential (primary) hypertension: Secondary | ICD-10-CM

## 2022-02-26 NOTE — Progress Notes (Signed)
Cardiology Office Note   Date:  02/26/2022   ID:  Steven Bury MD, DOB 1946-07-14, MRN [id number]  PCP:  Audley Hose, MD  Cardiologist: Stark Klein chief complaint on file.     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 male who presents for a follow-up visit regarding paroxysmal atrial fibrillation and coronary artery disease status post CABG.  He has been followed for paroxysmal atrial fibrillation. He moved from [place] in 2018. He is a retired OB/GYN MD. He has chronic medical conditions that include prostate cancer, essential hypertension, DM, previous TIA, hyperlipidemia and obesity. The patient had cardiac catheterization done at [facility] in October 2021 for severe exertional dyspnea which showed significant diffuse three-vessel coronary artery disease.  Left main disease was not significant, there was 80% diffuse calcified mid LAD disease, 60% ostial left circumflex disease and 70% stenosis in OM 2 and OM 3.  The RCA was occluded in the midsegment.  The patient underwent CABG in November, 2021 by Dr. Norm Parcel with LIMA to LAD, radial to OM 2, SVG to OM 3 and distal RCA.  In addition, he underwent pulmonary vein ablation and left atrial appendage clipping.  He had postoperative bradycardia that necessitated decreasing the dose of metoprolol.  He attended cardiac rehab and has been doing well with no recent chest pain, shortness of breath or palpitations.    Past Medical History:  Diagnosis Date   Cancer ([place])    Carcinoma in situ of prostate    Coronary artery disease    Diabetes mellitus without complication ([place])    History of transient ischemic attack (TIA)    Hyperlipidemia    Hypertension    Ischemia    Obesity    PAF (paroxysmal atrial fibrillation) ([facility])    Palpitations    Prostate CA ([facility])    SOB (shortness of breath)     Past Surgical History:  Procedure Laterality Date   CARDIAC CATHETERIZATION  06/2020   At [facility]: Three-vessel  coronary artery disease with 80% calcified diffuse mid LAD disease, 60% ostial left circumflex, 70% stenosis in OM 3 and OM 2 and occluded mid RCA   CORONARY ARTERY BYPASS GRAFT  07/2020    CABG by Dr. Norm Parcel with LIMA to LAD, radial to OM 2, SVG to OM 3 and distal RCA.  In addition, he underwent pulmonary vein ablation and left atrial appendage clipping.   IR CATHETER TUBE CHANGE  01/13/2018   PROSTATE SURGERY  10/2016   Seeds implanted     Current Outpatient Medications  Medication Sig Dispense Refill   amLODipine (NORVASC) 5 MG tablet Take 5 mg by mouth daily.     folic acid (FOLVITE) 1 MG tablet Take 1 mg by mouth daily.     furosemide (LASIX) 40 MG tablet Take 1 tablet (40 mg total) by mouth daily. 30 tablet 11   metFORMIN (GLUCOPHAGE) 500 MG tablet Take 500 mg by mouth 2 (two) times daily with a meal.     metoprolol tartrate (LOPRESSOR) 25 MG tablet Take 12.5 mg by mouth 2 (two) times daily.     potassium chloride SA (KLOR-CON) 20 MEQ tablet Take 20 mEq by mouth daily.     rivaroxaban (XARELTO) 20 MG TABS tablet Take 1 tablet (20 mg total) by mouth daily with supper. 30 tablet 5   rosuvastatin (CRESTOR) 40 MG tablet Take 1 tablet (40 mg total) by mouth daily. 90 tablet 3   No current  facility-administered medications for this visit.    Allergies:   Patient has no known allergies.    Social History:  The patient  reports that he has never smoked. He has never used smokeless tobacco. He reports current alcohol use. He reports that he does not use drugs.   Family History:  The patient's family history includes Breast cancer in his mother; Diabetes in his father; Stroke in his father.    ROS:  Please see the history of present illness.   Otherwise, review of systems are positive for none.   All other systems are reviewed and negative.    PHYSICAL EXAM: VS:  BP 128/76   Pulse 60   Ht '5\' 6"'$  (1.676 m)   Wt 212 lb 6.4 oz (96.3 kg)   SpO2 98%   BMI 34.28 kg/m  , BMI Body mass  index is 34.28 kg/m. GEN: Well nourished, well developed, in no acute distress  HEENT: normal  Neck: no JVD, carotid bruits, or masses Cardiac: RRR; no murmurs, rubs, or gallops,no edema  Respiratory:  clear to auscultation bilaterally, normal work of breathing GI: soft, nontender, nondistended, + BS MS: no deformity or atrophy  Skin: warm and dry, no rash Neuro:  Strength and sensation are intact Psych: euthymic mood, full affect   EKG:  EKG is ordered today. The ekg ordered today demonstrates sinus rhythm with first-degree AV block.  No significant ST or T wave changes.   Recent Labs: 03/05/2021: ALT 13; BUN 15; Creatinine, Ser 0.93; Hemoglobin 13.4; Platelets 215; Potassium 4.3; Sodium 143    Lipid Panel    Component Value Date/Time   CHOL 163 03/05/2021 1153   TRIG 79 03/05/2021 1153   HDL 67 03/05/2021 1153   CHOLHDL 2.4 03/05/2021 1153   LDLCALC 81 03/05/2021 1153      Wt Readings from Last 3 Encounters:  02/26/22 212 lb 6.4 oz (96.3 kg)  02/06/21 213 lb 9.6 oz (96.9 kg)  02/05/21 209 lb 3.5 oz (94.9 kg)          05/06/2017    9:02 AM  PAD Screen  Previous PAD dx? No  Previous surgical procedure? No  Pain with walking? No  Feet/toe relief with dangling? No  Painful, non-healing ulcers? No  Extremities discolored? No      ASSESSMENT AND PLAN:  1.  Coronary artery disease involving native coronary arteries: status post CABG. he is doing well overall with no anginal symptoms.  Continue medical therapy.   2. Paroxysmal atrial fibrillation/flutter: He did have pulmonary pain ablation and clipping of the left atrial appendage during his recent CABG.  Continue long-term anticoagulation with Xarelto.  Continue metoprolol.  I discussed with him the option of proceeding with TEE to ensure good closure of the left atrial appendage with potential stopping of Xarelto.  The patient reports strong family history of stroke and prefers to stay on anticoagulation.  He  will let me know if he changes his mind.  I think it is reasonable either way.  3. Essential hypertension: Blood pressures controlled on current medication.  4. Hyperlipidemia: I switched him from atorvastatin to rosuvastatin last year given that his LDL was 81.  He had subsequent labs with his primary care physician.  He is going to send me these results.    Disposition:   FU with me in 12 months   Signed,  Kathlyn Sacramento, MD  02/26/2022 1:59 PM    [facility]

## 2022-02-26 NOTE — Patient Instructions (Signed)
Medication Instructions:  Your physician recommends that you continue on your current medications as directed. Please refer to the Current Medication list given to you today.  *If you need a refill on your cardiac medications before your next appointment, please call your pharmacy*  Follow-Up: At [facility], you and your health needs are our priority.  As part of our continuing mission to provide you with exceptional heart care, we have created designated Provider Care Teams.  These Care Teams include your primary Cardiologist (physician) and Advanced Practice Providers (APPs -  Physician Assistants and Nurse Practitioners) who all work together to provide you with the care you need, when you need it.  We recommend signing up for the patient portal called "MyChart".  Sign up information is provided on this After Visit Summary.  MyChart is used to connect with patients for Virtual Visits (Telemedicine).  Patients are able to view lab/test results, encounter notes, upcoming appointments, etc.  Non-urgent messages can be sent to your provider as well.   To learn more about what you can do with MyChart, go to [email or web address].com.    Your next appointment:   12 month(s)  The format for your next appointment:   In Person  Provider:   Muhammad Arida, MD {    Important Information About Sugar       

## 2022-02-27 ENCOUNTER — Encounter: Payer: Self-pay | Admitting: Cardiovascular Disease

## 2022-02-28 ENCOUNTER — Telehealth: Payer: Self-pay

## 2022-02-28 NOTE — Telephone Encounter (Signed)
Left message on pt's vm w/ results and advised him that results are available via MyChart for review. Asked him to call back w/ any questions or concerns.

## 2022-02-28 NOTE — Telephone Encounter (Signed)
Wellington Hampshire, MD  02/27/2022  5:34 PM EDT Back to Top    Reviewed labs.  Stable overall with excellent cholesterol.

## 2022-08-09 ENCOUNTER — Other Ambulatory Visit: Payer: Self-pay | Admitting: Urology

## 2022-08-09 ENCOUNTER — Telehealth: Payer: Self-pay | Admitting: Cardiovascular Disease

## 2022-08-09 NOTE — Telephone Encounter (Signed)
   Pre-operative Risk Assessment    Patient Name: Steven LEPORE MD  DOB: 1945/11/22 MRN: [id number]      Request for Surgical Clearance    Procedure:   Balloon dilation of suprapubic tube site cystoscopy possible bladder biopsy  Date of Surgery:  Clearance 09/02/22                                 Surgeon:  Dr. Wyvonnia Dusky Group or Practice Name:  [facility] Phone number:  [phone], [phone] Fax number:  [phone]   Type of Clearance Requested:   - Pharmacy:  Hold Rivaroxaban (Xarelto)     Type of Anesthesia:  General    Additional requests/questions:      SignedHeloise Beecham   08/09/2022, 1:55 PM

## 2022-08-12 ENCOUNTER — Telehealth: Payer: Self-pay | Admitting: *Deleted

## 2022-08-12 NOTE — Telephone Encounter (Signed)
   Name: Steven MOLINE MD  DOB: Dec 19, 1945  MRN: [id number]  Primary Cardiologist: Kathlyn Sacramento, MD   Preoperative team, please contact this patient and set up a phone call appointment for further preoperative risk assessment. Please obtain consent and complete medication review. Thank you for your help.  I confirm that guidance regarding antiplatelet and oral anticoagulation therapy has been completed and, if necessary, noted below.  Pharmacy has addressed anticoagulation request.   Deberah Pelton, NP 08/12/2022, 10:31 AM [place]

## 2022-08-12 NOTE — Telephone Encounter (Signed)
Pt agreeable to plan of care for tele pre op appt 08/23/22 @ 10:20. Med rec and consent are done. Pt states he is not taking Xarelto currently due to hematuria, pt states he has been taking ASA 325 mg daily. Pt also state PCP stopped Amlodipine and changed him to Losartan 50 mg daily. I have made these changes in the med list per the pt information given today.     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   CONSENT FOR VIRTUAL VISIT FOR:  Steven Bury MD  By participating in this virtual visit I agree to the following:  I hereby voluntarily request, consent and authorize [facility] and its employed or contracted physicians, physician assistants, nurse practitioners or other licensed health care professionals (the Practitioner), to provide me with telemedicine health care services (the "Services") as deemed necessary by the treating Practitioner. I acknowledge and consent to receive the Services by the Practitioner via telemedicine. I understand that the telemedicine visit will involve communicating with the Practitioner through live audiovisual communication technology and the disclosure of certain medical information by electronic transmission. I acknowledge that I have been given the opportunity to request an in-person assessment or other available alternative prior to the telemedicine visit and am voluntarily participating in the telemedicine visit.  I understand that I have the right to withhold or withdraw my consent to the use of telemedicine in the course of my care at any time, without affecting my right to future care or treatment, and that the Practitioner or I may terminate the telemedicine visit at any time. I understand that I have the right to inspect all information obtained and/or recorded in the course of the telemedicine visit and may receive copies of available  information for a reasonable fee.  I understand that some of the potential risks of receiving the Services via telemedicine include:  Delay or interruption in medical evaluation due to technological equipment failure or disruption; Information transmitted may not be sufficient (e.g. poor resolution of images) to allow for appropriate medical decision making by the Practitioner; and/or  In rare instances, security protocols could fail, causing a breach of personal health information.  Furthermore, I acknowledge that it is my responsibility to provide information about my medical history, conditions and care that is complete and accurate to the best of my ability. I acknowledge that Practitioner's advice, recommendations, and/or decision may be based on factors not within their control, such as incomplete or inaccurate data provided by me or distortions of diagnostic images or specimens that may result from electronic transmissions. I understand that the practice of medicine is not an exact science and that Practitioner makes no warranties or guarantees regarding treatment outcomes. I acknowledge that a copy of this consent can be made available to me via my patient portal ([place]), or I can request a printed copy by calling the office of [facility].    I understand that my insurance will be billed for this visit.   I have read or had this consent read to me. I understand the contents of this consent, which adequately explains the benefits and risks of the Services being provided via telemedicine.  I have been provided ample opportunity to ask questions regarding this consent and the Services and have had my questions answered to my satisfaction. I give my informed consent  for the services to be provided through the use of telemedicine in my medical care

## 2022-08-12 NOTE — Telephone Encounter (Signed)
Pt agreeable to plan of care for tele pre op appt 08/23/22 @ 10:20. Med rec and consent are done. Pt states he is not taking Xarelto currently due to hematuria, pt states he has been taking ASA 325 mg daily. Pt also state PCP stopped Amlodipine and changed him to Losartan 50 mg daily. I have made these changes in the med list per the pt information given today.

## 2022-08-12 NOTE — Telephone Encounter (Signed)
Patient with diagnosis of afib on Xarelto for anticoagulation.    Procedure: Balloon dilation of suprapubic tube site cystoscopy possible bladder biopsy  Date of procedure: 09/02/22  CHA2DS2-VASc Score = 7  This indicates a 11.2% annual risk of stroke. The patient's score is based upon: CHF History: 0 HTN History: 1 Diabetes History: 1 Stroke History: 2 Vascular Disease History: 1 Age Score: 2 Gender Score: 0   CrCl 5m/min Platelet count 180K  Per office protocol, patient can hold Xarelto for 2 days prior to procedure. Resume as soon as safely possible after procedure given elevated CV risk.  **This guidance is not considered finalized until pre-operative APP has relayed final recommendations.**

## 2022-08-19 NOTE — Progress Notes (Signed)
COVID Vaccine received:  _0  No _1  Yes Date of any COVID positive Test in last 90 days: None  PCP - Latanya Presser, MD [place], [place] [address]  [phone]   Cardiologist - Kathlyn Sacramento, MD Cardiothoracic Surgeon- Merrie Roof, MD  [facility] [place], [place] [address]  [phone]   Fax) [phone]  Chest x-ray - 06-12-2019 Epic EKG -  02-26-2022 epic Stress Test -  ECHO - 2020 epic Cardiac Cath - 2021  CE  [facility]  CE CABG  x 4  in 2021  at [facility]  PCR screen: _2  Ordered & Completed                      _3   No Order but Needs PROFEND                      _4   N/A for this surgery  Surgery Plan:  _5  Ambulatory                            _6  Outpatient in bed                            _7  Admit  Anesthesia:    _8  General  _9  Spinal                           _10   Choice _11   MAC  Bowel Prep - _12  No  _13   Yes _____________  Pacemaker / ICD device _14  No _15  Yes        Device order form faxed _16  No    _17   Yes      Faxed to:  History of Sleep Apnea? _18  No _19  Yes   CPAP used?- _20  No _21  Yes    Does the patient monitor blood sugar? _22  No _23  Yes  _24  N/A Does patient have a [employer] or Dexacom? _25  No _26  Yes   Fasting Blood Sugar Ranges-  Checks Blood Sugar __0_ times a day  Last AIC  5.7 per patient METFORMIN- 500 mg q am, instructed patient to hold DOS.  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 His Father was the first Insurance risk surveyor here at [facility] back in the 1960s.  Activity level: Patient can climb a flight of stairs without difficulty; _29  No CP  _30  No SOB  Patient can perform ADLs without assistance.   Anesthesia review: A.Fib, CAD (CABG  x4 in 2021 at 26), DM, HTN, Hx Prostate Ca, Hx TIAs, Glaucoma in both eyes  Patient denies shortness of breath, fever, cough and chest pain at PAT appointment.  Patient verbalized understanding and agreement to the Pre-Surgical Instructions that were given to them at this PAT appointment. Patient was also educated of the need to review these PAT instructions again prior to his/her surgery.I reviewed the appropriate phone numbers to call if they have any and questions or concerns.

## 2022-08-19 NOTE — Patient Instructions (Signed)
SURGICAL WAITING ROOM VISITATION Patients having surgery or a procedure may have no more than 2 support people in the waiting area - these visitors may rotate in the visitor waiting room.   Children under the age of 70 must have an adult with them who is not the patient. If the patient needs to stay at the hospital during part of their recovery, the visitor guidelines for inpatient rooms apply.  PRE-OP VISITATION  Pre-op nurse will coordinate an appropriate time for 1 support person to accompany the patient in pre-op.  This support person may not rotate.  This visitor will be contacted when the time is appropriate for the visitor to come back in the pre-op area.  Please refer to the [facility] website for the visitor guidelines for Inpatients (after your surgery is over and you are in a regular room).  You are not required to quarantine at this time prior to your surgery. However, you must do this: Hand Hygiene often Do NOT share personal items Notify your provider if you are in close contact with someone who has COVID or you develop fever 100.4 or greater, new onset of sneezing, cough, sore throat, shortness of breath or body aches.  If you test positive for Covid or have been in contact with anyone that has tested positive in the last 10 days please notify you surgeon.    Your procedure is scheduled on:  Monday September 02, 2022  Report to [facility] Main Entrance: [place] entrance where the [employer] is available.   Report to admitting at: 09:15 AM  +++++Call this number if you have any questions or problems the morning of surgery [phone]  DO NOT EAT OR DRINK ANYTHING AFTER MIDNIGHT THE NIGHT PRIOR TO YOUR SURGERY / PROCEDURE.   FOLLOW BOWEL PREP AND ANY ADDITIONAL PRE OP INSTRUCTIONS YOU RECEIVED FROM YOUR SURGEON'S OFFICE!!!   Oral Hygiene is also important to reduce your risk of infection.        Remember - BRUSH YOUR TEETH THE MORNING OF SURGERY WITH YOUR  REGULAR TOOTHPASTE  Take ONLY these medicines the morning of surgery with A SIP OF WATER: Metoprolol                   You may not have any metal on your body including  jewelry, and body piercing  Do not wear lotions, powders, cologne, or deodorant  Men may shave face and neck.  Contacts, Hearing Aids, dentures or bridgework may not be worn into surgery.   Patients discharged on the day of surgery will not be allowed to drive home.  Someone NEEDS to stay with you for the first 24 hours after anesthesia.  Do not bring your home medications to the hospital. The Pharmacy will dispense medications listed on your medication list to you during your admission in the Hospital.  Special Instructions: Bring a copy of your healthcare power of attorney and living will documents the day of surgery, if you wish to have them scanned into your  Medical Records- EPIC  Please read over the following fact sheets you were given: IF YOU HAVE QUESTIONS ABOUT YOUR PRE-OP INSTRUCTIONS, PLEASE CALL [phone]  ([place])   [facility] - Preparing for Surgery Before surgery, you can play an important role.  Because skin is not sterile, your skin needs to be as free of germs as possible.  You can reduce the number of germs on your skin by washing with CHG (chlorahexidine gluconate) soap before  surgery.  CHG is an antiseptic cleaner which kills germs and bonds with the skin to continue killing germs even after washing. Please DO NOT use if you have an allergy to CHG or antibacterial soaps.  If your skin becomes reddened/irritated stop using the CHG and inform your nurse when you arrive at Short Stay. Do not shave (including legs and underarms) for at least 48 hours prior to the first CHG shower.  You may shave your face/neck.  Please follow these instructions carefully:  1.  Shower with CHG Soap the night before surgery and the  morning of surgery.  2.  If you choose to wash your hair, wash your hair first  as usual with your normal  shampoo.  3.  After you shampoo, rinse your hair and body thoroughly to remove the shampoo.                             4.  Use CHG as you would any other liquid soap.  You can apply chg directly to the skin and wash.  Gently with a scrungie or clean washcloth.  5.  Apply the CHG Soap to your body ONLY FROM THE NECK DOWN.   Do not use on face/ open                           Wound or open sores. Avoid contact with eyes, ears mouth and genitals (private parts).                       Wash face,  Genitals (private parts) with your normal soap.             6.  Wash thoroughly, paying special attention to the area where your  surgery  will be performed.  7.  Thoroughly rinse your body with warm water from the neck down.  8.  DO NOT shower/wash with your normal soap after using and rinsing off the CHG Soap.            9.  Pat yourself dry with a clean towel.            10.  Wear clean pajamas.            11.  Place clean sheets on your bed the night of your first shower and do not  sleep with pets.  ON THE DAY OF SURGERY : Do not apply any lotions/deodorants the morning of surgery.  Please wear clean clothes to the hospital/surgery center.    FAILURE TO FOLLOW THESE INSTRUCTIONS MAY RESULT IN THE CANCELLATION OF YOUR SURGERY  PATIENT SIGNATURE_________________________________  NURSE SIGNATURE__________________________________  ________________________________________________________________________

## 2022-08-21 ENCOUNTER — Other Ambulatory Visit: Payer: Self-pay

## 2022-08-21 ENCOUNTER — Encounter (HOSPITAL_COMMUNITY): Payer: Self-pay

## 2022-08-21 ENCOUNTER — Encounter (HOSPITAL_COMMUNITY)
Admission: RE | Admit: 2022-08-21 | Discharge: 2022-08-21 | Disposition: A | Discharge status: Home / Self Care | Payer: Medicare PPO | Source: Ambulatory Visit | Attending: Urology | Admitting: Urology

## 2022-08-21 VITALS — BP 137/74 | HR 66 | Temp 98.8°F | Resp 16 | Ht 66.0 in | Wt 218.0 lb

## 2022-08-21 DIAGNOSIS — I10 Essential (primary) hypertension: Secondary | ICD-10-CM | POA: Insufficient documentation

## 2022-08-21 DIAGNOSIS — Z01812 Encounter for preprocedural laboratory examination: Secondary | ICD-10-CM | POA: Diagnosis present

## 2022-08-21 LAB — BASIC METABOLIC PANEL
Anion gap: 7 (ref 5–15)
BUN: 21 mg/dL (ref 8–23)
CO2: 27 mmol/L (ref 22–32)
Calcium: 9 mg/dL (ref 8.9–10.3)
Chloride: 108 mmol/L (ref 98–111)
Creatinine, Ser: 1.2 mg/dL (ref 0.61–1.24)
GFR, Estimated: >60 mL/min (ref >60)
Glucose, Bld: 178 mg/dL — ABNORMAL HIGH (ref 70–99)
Potassium: 3.8 mmol/L (ref 3.5–5.1)
Sodium: 142 mmol/L (ref 135–145)

## 2022-08-21 LAB — CBC
HCT: 40.4 % (ref 39.0–52.0)
Hemoglobin: 12.3 g/dL — ABNORMAL LOW (ref 13.0–17.0)
MCH: 29.1 pg (ref 26.0–34.0)
MCHC: 30.4 g/dL (ref 30.0–36.0)
MCV: 95.5 fL (ref 80.0–100.0)
Platelets: 161 10*3/uL (ref 150–400)
RBC: 4.23 MIL/uL (ref 4.22–5.81)
RDW: 13.4 % (ref 11.5–15.5)
WBC: 7.5 10*3/uL (ref 4.0–10.5)
nRBC: 0 % (ref 0.0–0.2)

## 2022-08-23 ENCOUNTER — Ambulatory Visit: Payer: Medicare PPO | Attending: Cardiovascular Disease | Admitting: General Practice

## 2022-08-23 DIAGNOSIS — Z01810 Encounter for preprocedural cardiovascular examination: Secondary | ICD-10-CM

## 2022-08-23 NOTE — Progress Notes (Signed)
Virtual Visit via Telephone Note   Because of Cosme Loralyn Freshwater, MD's co-morbid illnesses, he is at least at moderate risk for complications without adequate follow up.  This format is felt to be most appropriate for this patient at this time.  The patient did not have access to video technology/had technical difficulties with video requiring transitioning to audio format only (telephone).  All issues noted in this document were discussed and addressed.  No physical exam could be performed with this format.  Please refer to the patient's chart for his consent to telehealth for [facility].  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 y/o male with a h/o coronary artery disease status post CABG, HTN, paroxysmal atrial fibrillation, hyperlipidemia who is pending balloon dilation of suprapubic tube site, cystoscopy with possible bladder biopsy and presents today for telephonic preoperative cardiovascular risk assessment.  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  Pt was last seen in cardiology clinic on 02/26/2022 by Dr. Fletcher Anon.  At that time Steven Bury, MD was doing well .  The patient is now pending procedure as outlined above. Since his last visit, he remains stable from a cardiac standpoint  Today he denies chest pain, shortness of breath, lower extremity edema, fatigue, palpitations, melena, hematuria, hemoptysis, diaphoresis, weakness, presyncope, syncope, orthopnea, and PND.   Past Medical History    Past Medical History:  Diagnosis Date   Cancer ([place])     Carcinoma in situ of prostate    Coronary artery disease    Diabetes mellitus without complication ([place])    History of transient ischemic attack (TIA)    Hyperlipidemia    Hypertension    Ischemia    Obesity    PAF (paroxysmal atrial fibrillation) ([facility])    Palpitations    Prostate CA ([place])    SOB (shortness of breath)    Past Surgical History:  Procedure Laterality Date   CARDIAC CATHETERIZATION  06/2020   At [facility]: Three-vessel coronary artery disease with 80% calcified diffuse mid LAD disease, 60% ostial left circumflex, 70% stenosis in OM 3 and OM 2 and occluded mid RCA   CORONARY ARTERY BYPASS GRAFT  07/2020    CABG by Dr. Norm Parcel with LIMA to LAD, radial to OM 2, SVG to OM 3 and distal RCA.  In addition, he underwent pulmonary vein ablation and left atrial appendage clipping.   IR CATHETER TUBE CHANGE  01/13/2018   PROSTATE SURGERY  10/2016   Seeds implanted    Allergies  No Known Allergies  Home Medications    Prior to Admission medications   Medication Sig Start Date End Date Taking? Authorizing Provider  aspirin EC 325 MG tablet Take 325 mg by mouth daily.    [provider]  bimatoprost (LUMIGAN) 0.01 % SOLN Place 1 drop into both eyes at bedtime.    [provider]  folic acid (FOLVITE) 1 MG tablet Take 1 mg by mouth daily. 02/07/22   [provider]  furosemide (LASIX) 40 MG tablet Take 1 tablet (40 mg total) by mouth daily. Patient  taking differently: Take 20 mg by mouth at bedtime. 10/10/20 08/19/22  Wellington Hampshire, MD  losartan (COZAAR) 50 MG tablet Take 50 mg by mouth daily.    [provider]  metFORMIN (GLUCOPHAGE) 500 MG tablet Take 500 mg by mouth daily with breakfast.    [provider]  metoprolol tartrate (LOPRESSOR) 25 MG tablet Take 12.5 mg by mouth 2 (two) times daily. 09/20/20 08/19/22  [provider]  potassium chloride SA (KLOR-CON) 20 MEQ tablet Take 20 mEq by mouth daily.    [provider]  rivaroxaban (XARELTO) 20 MG TABS tablet Take 1 tablet (20 mg total) by mouth daily with supper. 05/06/17   Wellington Hampshire, MD  rosuvastatin (CRESTOR) 40 MG tablet Take 1 tablet (40 mg total) by mouth daily. Patient taking differently: Take 40 mg by mouth at bedtime. 03/07/21 08/19/22  Wellington Hampshire, MD  TURMERIC PO Take 1,000 mg by mouth daily.    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  Given telephonic nature of communication, physical exam is limited. AAOx3. NAD. Normal affect.  Speech and respirations are unlabored.  Accessory Clinical Findings    None  Assessment & Plan    1.  Preoperative Cardiovascular Risk Assessment: Balloon dilation of suprapubic tube site, cystoscopy with possible bladder biopsy, Dr. Alinda Money, [facility]      Primary Cardiologist: Kathlyn Sacramento, MD  Chart reviewed as part of pre-operative protocol coverage. 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   Patient was advised that if he develops new symptoms prior to surgery to contact our office to arrange a follow-up appointment.  He verbalized understanding.  His RCRI is a class IV risk, 11% risk of major cardiac event.  He is able to complete greater than 4 METS of physical activity.  Patient with diagnosis of afib on Xarelto for anticoagulation.     Procedure: Balloon dilation of suprapubic tube site cystoscopy possible bladder biopsy  Date of procedure: 09/02/22   CHA2DS2-VASc Score = 7  This indicates a 11.2% annual risk of stroke. The patient's score is based upon: CHF History: 0 HTN History: 1 Diabetes History: 1 Stroke History: 2 Vascular Disease History: 1 Age Score: 2 Gender Score: 0   CrCl 28m/min Platelet  count 180K   Per office protocol, patient can hold Xarelto for 2 days prior to procedure. Resume as soon as safely possible after procedure given elevated CV risk.      I will route this recommendation to the requesting party via Epic fax function and remove from pre-op pool.  Please call with questions.       Time:   Today, I have spent 5 preoperative cardiac evaluation minutes with the patient with telehealth technology discussing medical history, symptoms, and management plan.  Prior to his phone evaluation I spent greater than 10 minutes reviewing his past medical history and cardiac medications.   JDeberah Pelton NP  08/23/2022, 7:38 AM

## 2022-08-26 NOTE — Anesthesia Preprocedure Evaluation (Addendum)
Anesthesia Evaluation  Patient identified by MRN, date of birth, ID band Patient awake    Reviewed: Allergy & Precautions, NPO status , Patient's Chart, lab work & pertinent test results  History of Anesthesia Complications Negative for: history of anesthetic complications  Airway Mallampati: II  TM Distance: >3 FB Neck ROM: Full    Dental  (+) Missing, Dental Advisory Given   Pulmonary neg pulmonary ROS   breath sounds clear to auscultation       Cardiovascular hypertension, Pt. on medications and Pt. on home beta blockers (-) angina + CAD and + CABG  + dysrhythmias Atrial Fibrillation  Rhythm:Regular Rate:Normal  '20 ECHO: EF 60-65%. The cavity size was normal. Mild basal septal  hypertrophy. Left ventricular diastolic function could not be evaluated  due to indeterminate diastolic function.   2. The average left ventricular global longitudinal strain is -20.2 %.   3. The right ventricle has normal systolic function. The cavity was  normal. There is no increase in right ventricular wall thickness.   4. There is mild mitral annular calcification present.   5. The aortic valve is tricuspid. Mild sclerosis of the aortic valve.     Neuro/Psych TIA   GI/Hepatic negative GI ROS, Neg liver ROS,,,  Endo/Other  diabetes, Oral Hypoglycemic Agents  BMI 34.9  Renal/GU negative Renal ROS     Musculoskeletal   Abdominal  (+) + obese  Peds  Hematology xarelto   Anesthesia Other Findings Prostate cancer  Reproductive/Obstetrics                             Anesthesia Physical Anesthesia Plan  ASA: 3  Anesthesia Plan: General   Post-op Pain Management: Tylenol PO (pre-op)*   Induction: Intravenous  PONV Risk Score and Plan: 2 and Ondansetron and Dexamethasone  Airway Management Planned: LMA  Additional Equipment: None  Intra-op Plan:   Post-operative Plan:   Informed Consent: I have  reviewed the patients History and Physical, chart, labs and discussed the procedure including the risks, benefits and alternatives for the proposed anesthesia with the patient or authorized representative who has indicated his/her understanding and acceptance.     Dental advisory given  Plan Discussed with: CRNA and Surgeon  Anesthesia Plan Comments: (See PAT note 08/21/2022)       Anesthesia Quick Evaluation

## 2022-08-26 NOTE — Progress Notes (Signed)
Anesthesia Chart Review   Case: [id number] Date/Time: 09/02/22 1115   Procedures:      BALLOON DILATIONOF SUPRAPUBIC TUBE SITE, POSSIBLE BLADDER BIOPSISES - 60 MINUTES NEEDED FOR CASE     CYSTOSCOPY   Anesthesia type: General   Pre-op diagnosis: HEMATURIA, URETHRAL STRICTURE   Location: [facility]   Surgeons: Raynelle Bring, MD       DISCUSSION:76 y.o. never smoker with h/o HTN, DM II, PAF, CAD (CABG), prostate cancer, hematuria, urethral stricture scheduled for above procedure 09/02/2022 with Dr. Raynelle Bring.   Pt seen by cardiology 08/23/2022. Per OV note, "Chart reviewed as part of pre-operative protocol coverage. Given past medical history and time since last visit, based on ACC/AHA guidelines, [name], MD would be at acceptable risk for the planned procedure without further cardiovascular testing.    Patient was advised that if he develops new symptoms prior to surgery to contact our office to arrange a follow-up appointment.  He verbalized understanding.   His RCRI is a class IV risk, 11% risk of major cardiac event.  He is able to complete greater than 4 METS of physical activity.   Pt advised to hold Xarelto 2 days prior to procedure.   Anticipate pt can proceed with planned procedure barring acute status change.      VS: BP 137/74 Comment: right arm sitting  Pulse 66   Temp 37.1 C (Oral)   Resp 16   Ht '5\' 6"'$  (1.676 m)   Wt 98.9 kg   SpO2 98%   BMI 35.19 kg/m   PROVIDERS: Audley Hose, MD is PCP   Primary Cardiologist:  Kathlyn Sacramento, MD  LABS: Labs reviewed: Acceptable for surgery. (all labs ordered are listed, but only abnormal results are displayed)  Labs Reviewed  BASIC METABOLIC PANEL - Abnormal; Notable for the following components:      Result Value   Glucose, Bld 178 (*)    All other components within normal limits  CBC - Abnormal; Notable for the following components:   Hemoglobin 12.3 (*)    All other components within  normal limits     IMAGES:   EKG:   CV: Echo 05/11/2019  1. The left ventricle has normal systolic function with an ejection  fraction of 60-65%. The cavity size was normal. Mild basal septal  hypertrophy. Left ventricular diastolic function could not be evaluated  due to indeterminate diastolic function.   2. The average left ventricular global longitudinal strain is -20.2 %.   3. The right ventricle has normal systolic function. The cavity was  normal. There is no increase in right ventricular wall thickness.   4. There is mild mitral annular calcification present.   5. The aortic valve is tricuspid. Mild sclerosis of the aortic valve.   6. The aorta is normal unless otherwise noted.  Past Medical History:  Diagnosis Date   Cancer ([place])    Carcinoma in situ of prostate    Coronary artery disease    Diabetes mellitus without complication ([place])    History of transient ischemic attack (TIA)    Hyperlipidemia    Hypertension    Ischemia    Obesity    PAF (paroxysmal atrial fibrillation) ([facility])    Palpitations    Prostate CA ([facility])    SOB (shortness of breath)     Past Surgical History:  Procedure Laterality Date   CARDIAC CATHETERIZATION  06/2020   At [facility]: Three-vessel coronary artery disease with 80%  calcified diffuse mid LAD disease, 60% ostial left circumflex, 70% stenosis in OM 3 and OM 2 and occluded mid RCA   CORONARY ARTERY BYPASS GRAFT  07/2020    CABG by Dr. Norm Parcel with LIMA to LAD, radial to OM 2, SVG to OM 3 and distal RCA.  In addition, he underwent pulmonary vein ablation and left atrial appendage clipping.   IR CATHETER TUBE CHANGE  01/13/2018   PROSTATE SURGERY  10/2016   Seeds implanted    MEDICATIONS:  aspirin EC 325 MG tablet   bimatoprost (LUMIGAN) 4.40 % SOLN   folic acid (FOLVITE) 1 MG tablet   furosemide (LASIX) 40 MG tablet   losartan (COZAAR) 50 MG tablet   metFORMIN (GLUCOPHAGE) 500 MG tablet   metoprolol tartrate (LOPRESSOR) 25 MG tablet    potassium chloride SA (KLOR-CON) 20 MEQ tablet   rivaroxaban (XARELTO) 20 MG TABS tablet   rosuvastatin (CRESTOR) 40 MG tablet   TURMERIC PO   No current facility-administered medications for this encounter.   Konrad Felix Ward, PA-C [facility] [phone]

## 2022-08-30 NOTE — H&P (Signed)
1. Prostate cancer  2. Urethral stricture/urinary retention  3. Gross hematuria   For full details, please see my note from 12/21/2021. 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 He is anticoagulated with Xarelto for history of atrial fibrillation. He did stop this on his own yesterday and his urine has now cleared. He does have severe urethral stricture disease that does not allow access to his bladder. He does have a 59 [place] chronic indwelling suprapubic tube. He is here to have that changed today. He has not had difficulty with clot urinary retention. He did leave a urine specimen here yesterday that demonstrated greater than 60 red blood cells but did not have evidence of infection. He otherwise denies any flank pain, fever, or other symptoms.     ALLERGIES: None    MEDICATIONS: Metformin Hcl  Metoprolol Succinate  Atorvastatin Calcium  Lisinopril-Hydrochlorothiazide  Xarelto     GU PSH: Catheterization For Collection Of Specimen, Single Patient, All Places Of Service - 2019 Simple Change SP Tube - 07/01/2022, 05/31/2022, 05/03/2022, 03/25/2022, 02/26/2022, 01/28/2022, 12/21/2021, 11/05/2021, 10/08/2021, 09/07/2021, 08/09/2021, 07/13/2021, 05/17/2021, 04/02/2021, 03/05/2021, 02/02/2021, 2022, 2022, 2022, 2022, 08/03/2020, 2021, 2021, 2021, 2021, 2021, 2021, 2021, 2021, 2021, 2020, 2020, 2020, 2020, 2020, 2020, 2020, 2020, 2020, 2019       PSH Notes: cyst removal from forehead 07/2019   NON-GU PSH: CABG (coronary artery bypass grafting)     GU PMH: Bulbar urethral stricture - 07/01/2022, - 05/31/2022, - 12/21/2021, - 07/13/2021, - 05/17/2021, - 2022, - 2019 Urinary Urgency - 05/03/2022, - 2021 Incomplete bladder emptying - 03/25/2022, - 02/26/2022, - 01/28/2022, - 11/05/2021, - 10/08/2021, - 09/07/2021, - 08/09/2021, - 04/02/2021, - 03/05/2021, - 02/02/2021, - 2022, - 2022, - 2022, - 2019 Prostate Cancer - 12/21/2021, - 05/17/2021, - 2022, -  2018 Acute Cystitis/UTI - 08/03/2020, - 2021, - 2021, - 2019      PMH Notes:   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 He was also noted to have a nodule on his prostate. This was reportedly on the right side of the prostate per his records. He underwent a prostate needle biopsy on 05/23/16 that demonstrated 6 out of 12 biopsy cores to be positive for adenocarcinoma. Interestingly, all of these positive biopsies were on the left side of the prostate. He was found to have Gleason 3+4 = 7 adenocarcinoma in 4 cores and Gleason 3+3 = 6 in 2 cores. He ultimately underwent primary radiation therapy with short-term androgen deprivation and combination IMRT and radiation seed implantation. He completed IMRT in [place], [place] and received 46 gray in 23 fractions from December 2017 through January 2018. He was then evaluated by Dr. Lyndon Code at [facility] and proceeded with a radiation seed implant boost in February 2018. He presented to me in September 2019.   PSA nadir after treatment: < 0.04   2) BPH/LUTS: He developed severe LUTS after radiation therapy. His baseline IPSS when he presented to me in September 2019 was 26. He also had incontinence using one pad per day.   Current treatment: None (SP tube)   3) UTIs: He developed worsening LUTS in February 2019 and was found to have a positive urine culture.   Feb 2019: E. coli -  fluoroquinolone resistant but otherwise pansensitive (treated with doxycycline)  Feb 2019: E. coli - same cultures results as above (treated with Bactrim - ER switched to cefdinir for unclear reasons and did check another culture)    4) Urethral stricture: He developed a bulbar urethral stricture in 2019 resulting in urinary retention. Attempts at catheter placement  were unsuccessful and he underwent SP tube placement by IR. He was seen by Dr. Francesca Jewett at [facility] in consultation but did not elect further follow up at [facility]. Ultimately, he has decided to forego urethral reconstruction and instead continue with chronic SP tube management.   NON-GU PMH: Bacteriuria - 2021 Coronary Artery Disease    FAMILY HISTORY: 2 daughters - Runs in Family 1 son - Runs in Family Acute CVA (cerebrovascular accident) - Father Breast Cancer - Mother Death - Father, Mother   SOCIAL HISTORY: Marital Status: Married Preferred Language: English; Ethnicity: Not Hispanic Or Latino; Race: Black or African American Current Smoking Status: Patient has never smoked.   Tobacco Use Assessment Completed: Used Tobacco in last 30 days? Does drink.  Drinks 1 caffeinated drink per day.    REVIEW OF SYSTEMS:    GU Review Male:   Patient denies frequent urination, hard to postpone urination, burning/ pain with urination, get up at night to urinate, leakage of urine, stream starts and stops, trouble starting your streams, and have to strain to urinate .  Gastrointestinal (Upper):   Patient denies nausea and vomiting.  Gastrointestinal (Lower):   Patient denies diarrhea and constipation.  Constitutional:   Patient denies fever, night sweats, weight loss, and fatigue.  Skin:   Patient denies skin rash/ lesion and itching.  Eyes:   Patient denies blurred vision and double vision.  Ears/ Nose/ Throat:   Patient denies sore throat and sinus problems.  Hematologic/Lymphatic:   Patient denies swollen glands and easy bruising.  Cardiovascular:   Patient denies leg swelling and chest pains.  Respiratory:   Patient denies cough and shortness of breath.  Endocrine:   Patient denies excessive thirst.  Musculoskeletal:   Patient denies back pain and joint pain.  Neurological:   Patient denies headaches and dizziness.  Psychologic:   Patient denies depression and anxiety.   VITAL SIGNS: None    MULTI-SYSTEM PHYSICAL EXAMINATION:    Constitutional: Well-nourished. No physical deformities. Normally developed. Good grooming.  Respiratory: No labored breathing, no use of accessory muscles.   Cardiovascular: Normal temperature, normal extremity pulses, no swelling, no varicosities.     Complexity of Data:  Records Review:   Previous Patient Records   12/03/21 05/11/21 10/30/20 03/31/20 10/12/19 04/13/19 09/25/18 05/27/17  PSA  Total PSA <0.015 ng/mL <0.015 ng/mL <0.015 ng/mL 0.016 ng/mL 0.024 ng/mL 0.031 ng/mL 0.030 ng/mL 0.021 ng/dL    PROCEDURES:         Simple Change SP Tube - 51705  The patient's indwelling SP tube was carefully removed. A 14 French Foley catheter was inserted into the bladder using sterile technique. The patient was taught routine catheter care. Hand irrigation of the bladder with sterile water was performed. A leg bag was connected.   ASSESSMENT:      ICD-10 Details  1 GU:   Bulbar urethral stricture - N35.011   2   Prostate Cancer - C61   3   Gross hematuria - R31.0    PLAN:           Orders Labs BUN/Creatinine  X-Rays: C.T. Hematuria With and Without I.V. Contrast  Schedule Return Visit/Planned Activity: Other See Visit Notes             Note: Will call with CT scan results and to schedule surgery.          Document Letter(s):  Created for Patient: Clinical Summary         Notes:   1. Prostate cancer: He will plan to have his PSA checked in April as scheduled.   2. Urethral stricture/urinary retention: His suprapubic tube was changed today. I did offer him the option of trying to upsize his suprapubic tube considering his hematuria but he wished to keep his 25 French suprapubic tube for now.   3. Gross hematuria: In the absence of obvious infection, I have recommended that we proceed with a full hematuria evaluation including upper tract imaging with a CT scan as well as cystoscopy. He will have his renal function checked today and  plan to proceed with a CT scan. As for his cystoscopy, his bladder will not be easily accessed considering the only access is a 71 [place] tract in his suprapubic region. I offered him the option of trying to upsize his suprapubic tube today and proceeding with office cystoscopy versus proceeding with evaluation in the operating room after dilating the suprapubic tract. In the end, he is interested in the latter approach. He will therefore be scheduled for balloon dilation of his suprapubic tube tract with cystoscopy to be performed at that time. We will await the results of the CT scan to ensure no other evaluation would be necessary. If he did have a bladder tumor or other abnormality, we will be prepared to perform a bladder biopsy or transurethral resection if needed. He will restart his Xarelto in the next few days if his urine remains clear but will stop this 48 hours prior to his planned upcoming procedure.   CC: Dr. Seward Carol        Next Appointment:      Next Appointment: 12/27/2022 02:15 PM    Appointment Type: Laboratory Appointment    Location: [facility] [phone]    Provider: Lab LAB    Reason for Visit: 1 YEAR PSA      * Signed by Raynelle Bring, M.D. on 08/03/22 at 4:52 PM (EST)*

## 2022-09-02 ENCOUNTER — Ambulatory Visit (HOSPITAL_BASED_OUTPATIENT_CLINIC_OR_DEPARTMENT_OTHER): Payer: Medicare PPO | Admitting: Certified Registered"

## 2022-09-02 ENCOUNTER — Ambulatory Visit (HOSPITAL_COMMUNITY): Payer: Medicare PPO

## 2022-09-02 ENCOUNTER — Encounter (HOSPITAL_COMMUNITY): Payer: Self-pay | Admitting: Urology

## 2022-09-02 ENCOUNTER — Encounter (HOSPITAL_COMMUNITY)
Admission: RE | Disposition: A | Discharge status: Home / Self Care | Payer: Self-pay | Source: Ambulatory Visit | Attending: Urology

## 2022-09-02 ENCOUNTER — Ambulatory Visit (HOSPITAL_COMMUNITY)
Admission: RE | Admit: 2022-09-02 | Discharge: 2022-09-02 | Disposition: A | Discharge status: Home / Self Care | Payer: Medicare PPO | Source: Ambulatory Visit | Attending: Urology | Admitting: Urology

## 2022-09-02 ENCOUNTER — Other Ambulatory Visit: Payer: Self-pay

## 2022-09-02 ENCOUNTER — Ambulatory Visit (HOSPITAL_COMMUNITY): Payer: Medicare PPO | Admitting: Physician Assistant

## 2022-09-02 DIAGNOSIS — Z7984 Long term (current) use of oral hypoglycemic drugs: Secondary | ICD-10-CM | POA: Insufficient documentation

## 2022-09-02 DIAGNOSIS — N35919 Unspecified urethral stricture, male, unspecified site: Secondary | ICD-10-CM | POA: Diagnosis not present

## 2022-09-02 DIAGNOSIS — E119 Type 2 diabetes mellitus without complications: Secondary | ICD-10-CM | POA: Insufficient documentation

## 2022-09-02 DIAGNOSIS — N35912 Unspecified bulbous urethral stricture, male: Secondary | ICD-10-CM | POA: Insufficient documentation

## 2022-09-02 DIAGNOSIS — Z6834 Body mass index (BMI) 34.0-34.9, adult: Secondary | ICD-10-CM | POA: Diagnosis not present

## 2022-09-02 DIAGNOSIS — R310 Gross hematuria: Secondary | ICD-10-CM

## 2022-09-02 DIAGNOSIS — N3081 Other cystitis with hematuria: Secondary | ICD-10-CM | POA: Diagnosis not present

## 2022-09-02 DIAGNOSIS — I2510 Atherosclerotic heart disease of native coronary artery without angina pectoris: Secondary | ICD-10-CM | POA: Insufficient documentation

## 2022-09-02 DIAGNOSIS — I10 Essential (primary) hypertension: Secondary | ICD-10-CM

## 2022-09-02 DIAGNOSIS — E669 Obesity, unspecified: Secondary | ICD-10-CM | POA: Diagnosis not present

## 2022-09-02 DIAGNOSIS — Z923 Personal history of irradiation: Secondary | ICD-10-CM | POA: Insufficient documentation

## 2022-09-02 DIAGNOSIS — I358 Other nonrheumatic aortic valve disorders: Secondary | ICD-10-CM | POA: Insufficient documentation

## 2022-09-02 DIAGNOSIS — Z8546 Personal history of malignant neoplasm of prostate: Secondary | ICD-10-CM | POA: Insufficient documentation

## 2022-09-02 DIAGNOSIS — I4891 Unspecified atrial fibrillation: Secondary | ICD-10-CM | POA: Insufficient documentation

## 2022-09-02 DIAGNOSIS — Z951 Presence of aortocoronary bypass graft: Secondary | ICD-10-CM | POA: Diagnosis not present

## 2022-09-02 HISTORY — PX: BALLOON DILATION: SHX5330

## 2022-09-02 LAB — GLUCOSE, CAPILLARY: Glucose-Capillary: 112 mg/dL — ABNORMAL HIGH (ref 70–99)

## 2022-09-02 SURGERY — BALLOON DILATION
Anesthesia: General | Site: Bladder

## 2022-09-02 MED ORDER — DEXAMETHASONE SODIUM PHOSPHATE 10 MG/ML IJ SOLN
INTRAMUSCULAR | Status: DC | PRN
  Administered 2022-09-02: 5 mg via INTRAVENOUS

## 2022-09-02 MED ORDER — OXYCODONE HCL 5 MG PO TABS: 5.0000 mg | ORAL_TABLET | Freq: Once | ORAL | Status: DC | PRN

## 2022-09-02 MED ORDER — EPHEDRINE SULFATE-NACL 50-0.9 MG/10ML-% IV SOSY
PREFILLED_SYRINGE | INTRAVENOUS | Status: DC | PRN
  Administered 2022-09-02 (×5): 5 mg via INTRAVENOUS

## 2022-09-02 MED ORDER — PHENYLEPHRINE 80 MCG/ML (10ML) SYRINGE FOR IV PUSH (FOR BLOOD PRESSURE SUPPORT)
PREFILLED_SYRINGE | INTRAVENOUS | Status: DC | PRN
  Administered 2022-09-02 (×3): 40 ug via INTRAVENOUS

## 2022-09-02 MED ORDER — DEXAMETHASONE SODIUM PHOSPHATE 10 MG/ML IJ SOLN
INTRAMUSCULAR | Status: AC
  Filled 2022-09-02: qty 1

## 2022-09-02 MED ORDER — LIDOCAINE HCL (PF) 2 % IJ SOLN
INTRAMUSCULAR | Status: AC
  Filled 2022-09-02: qty 5

## 2022-09-02 MED ORDER — ONDANSETRON HCL 4 MG/2ML IJ SOLN
INTRAMUSCULAR | Status: AC
  Filled 2022-09-02: qty 2

## 2022-09-02 MED ORDER — LIDOCAINE 2% (20 MG/ML) 5 ML SYRINGE
INTRAMUSCULAR | Status: DC | PRN
  Administered 2022-09-02: 40 mg via INTRAVENOUS

## 2022-09-02 MED ORDER — CEFAZOLIN SODIUM-DEXTROSE 2-4 GM/100ML-% IV SOLN
2.0000 g | INTRAVENOUS | Status: AC
  Administered 2022-09-02: 2 g via INTRAVENOUS
  Filled 2022-09-02: qty 100

## 2022-09-02 MED ORDER — PROPOFOL 10 MG/ML IV BOLUS
INTRAVENOUS | Status: AC
  Filled 2022-09-02: qty 20

## 2022-09-02 MED ORDER — ORAL CARE MOUTH RINSE: 15.0000 mL | Freq: Once | OROMUCOSAL | Status: AC

## 2022-09-02 MED ORDER — PROPOFOL 10 MG/ML IV BOLUS
INTRAVENOUS | Status: DC | PRN
  Administered 2022-09-02: 120 mg via INTRAVENOUS

## 2022-09-02 MED ORDER — FENTANYL CITRATE PF 50 MCG/ML IJ SOSY: 25.0000 ug | PREFILLED_SYRINGE | INTRAMUSCULAR | Status: DC | PRN

## 2022-09-02 MED ORDER — STERILE WATER FOR IRRIGATION IR SOLN
Status: DC | PRN
  Administered 2022-09-02: 3000 mL

## 2022-09-02 MED ORDER — LACTATED RINGERS IV SOLN: INTRAVENOUS | Status: DC

## 2022-09-02 MED ORDER — FENTANYL CITRATE (PF) 250 MCG/5ML IJ SOLN
INTRAMUSCULAR | Status: AC
  Filled 2022-09-02: qty 5

## 2022-09-02 MED ORDER — STERILE WATER FOR IRRIGATION IR SOLN
Status: DC | PRN
  Administered 2022-09-02: 250 mL

## 2022-09-02 MED ORDER — SODIUM CHLORIDE 0.9 % IR SOLN
Status: DC | PRN
  Administered 2022-09-02: 3000 mL

## 2022-09-02 MED ORDER — ONDANSETRON HCL 4 MG/2ML IJ SOLN
INTRAMUSCULAR | Status: DC | PRN
  Administered 2022-09-02: 4 mg via INTRAVENOUS

## 2022-09-02 MED ORDER — 0.9 % SODIUM CHLORIDE (POUR BTL) OPTIME
TOPICAL | Status: DC | PRN
  Administered 2022-09-02: 1000 mL

## 2022-09-02 MED ORDER — PROMETHAZINE HCL 25 MG/ML IJ SOLN: 6.2500 mg | INTRAMUSCULAR | Status: DC | PRN

## 2022-09-02 MED ORDER — IOHEXOL 300 MG/ML  SOLN
INTRAMUSCULAR | Status: DC | PRN
  Administered 2022-09-02: 10 mL

## 2022-09-02 MED ORDER — MIDAZOLAM HCL 2 MG/2ML IJ SOLN: 0.5000 mg | Freq: Once | INTRAMUSCULAR | Status: DC | PRN

## 2022-09-02 MED ORDER — FENTANYL CITRATE (PF) 100 MCG/2ML IJ SOLN
INTRAMUSCULAR | Status: DC | PRN
  Administered 2022-09-02 (×2): 50 ug via INTRAVENOUS
  Administered 2022-09-02 (×2): 25 ug via INTRAVENOUS

## 2022-09-02 MED ORDER — CHLORHEXIDINE GLUCONATE 0.12 % MT SOLN
15.0000 mL | Freq: Once | OROMUCOSAL | Status: AC
  Administered 2022-09-02: 15 mL via OROMUCOSAL

## 2022-09-02 MED ORDER — OXYCODONE HCL 5 MG/5ML PO SOLN: 5.0000 mg | Freq: Once | ORAL | Status: DC | PRN

## 2022-09-02 SURGICAL SUPPLY — 27 items
BAG URINE DRAIN 2000ML AR STRL (UROLOGICAL SUPPLIES) ×2 IMPLANT
BAG URO CATCHER STRL LF (MISCELLANEOUS) ×2 IMPLANT
BALLN NEPHROSTOMY (BALLOONS) ×2
BALLOON NEPHROSTOMY (BALLOONS) ×1 IMPLANT
CATH FOLEY 2W COUNCIL 20FR 5CC (CATHETERS) IMPLANT
CATH FOLEY 2WAY SLVR  5CC 16FR (CATHETERS) ×2
CATH FOLEY 2WAY SLVR 5CC 16FR (CATHETERS) ×1 IMPLANT
CATH ROBINSON RED A/P 14FR (CATHETERS) IMPLANT
CATH URET 5FR 28IN CONE TIP (BALLOONS)
CATH URET 5FR 70CM CONE TIP (BALLOONS) IMPLANT
CATH URETL OPEN END 6FR 70 (CATHETERS) IMPLANT
CLOTH BEACON ORANGE TIMEOUT ST (SAFETY) ×2 IMPLANT
GLOVE BIO SURGEON STRL SZ7.5 (GLOVE) ×2 IMPLANT
GLOVE SURG LX STRL 7.5 STRW (GLOVE) ×2 IMPLANT
GOWN STRL REUS W/ TWL XL LVL3 (GOWN DISPOSABLE) ×2 IMPLANT
GOWN STRL REUS W/TWL XL LVL3 (GOWN DISPOSABLE) ×2
GUIDEWIRE ANG ZIPWIRE 038X150 (WIRE) IMPLANT
GUIDEWIRE STR DUAL SENSOR (WIRE) ×2 IMPLANT
GUIDEWIRE ZIPWRE .038 STRAIGHT (WIRE) IMPLANT
KIT TURNOVER KIT A (KITS) IMPLANT
MANIFOLD NEPTUNE II (INSTRUMENTS) ×2 IMPLANT
NS IRRIG 1000ML POUR BTL (IV SOLUTION) IMPLANT
PACK CYSTO (CUSTOM PROCEDURE TRAY) ×2 IMPLANT
PENCIL SMOKE EVACUATOR (MISCELLANEOUS) IMPLANT
TUBING CONNECTING 10 (TUBING) ×2 IMPLANT
TUBING UROLOGY SET (TUBING) IMPLANT
WATER STERILE IRR 3000ML UROMA (IV SOLUTION) ×2 IMPLANT

## 2022-09-02 NOTE — Op Note (Signed)
Preoperative diagnosis: Hematuria, urethral stricture  Postoperative diagnosis: Hematuria, urethral stricture  Procedures: 1.  Urethroscopy with retrograde urethrogram 2.  Flexible cystoscopy 3.  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  He has a known bulbar urethral stricture and has been managed with an indwelling 14 French suprapubic tube.  He recently developed gross hematuria.  Hematuria protocol CT scan was unremarkable and he follows up today to undergo cystoscopy in the operating room due to his difficult anatomy making access to the bladder complex.  The potential risks, complications, and expected recovery process was discussed in detail.  Informed consent was obtained.  Description of procedure: The patient was taken the operating room and a general anesthetic was administered.  He was given preoperative antibiotics, placed in the dorsolithotomy position, and prepped and draped in the usual sterile fashion.  Flexible cystourethroscopy was then performed.  At the bulbar urethra, he was noted to have a very narrowed stricture as previously noted.  Omnipaque contrast was then injected under fluoroscopy and there was noted to be an extensive bulbar and prostatic urethral stricture measuring approximately 3 cm.  Attention then turned to his suprapubic tube tract.  The flexible cystoscope was able to be placed into the bladder without difficulty through his previous tract.  The bladder was then systematically examined.  The ureteral orifices were difficult to identify.  There was minimal mucosal edema with an area of raised edematous tissue just inside the suprapubic tube tract anteriorly on the bladder.  As a precaution, flexible biopsy forceps were used to take multiple biopsies of this area.  These  areas were then fulgurated with a Bugbee electrode.  Bladder was emptied and reinspected and hemostasis appeared excellent.  The patient tolerated the procedure well without complications.  A new 42 French Foley catheter was placed into the suprapubic tube at the end of the procedure.

## 2022-09-02 NOTE — Anesthesia Procedure Notes (Signed)
Procedure Name: LMA Insertion Date/Time: 09/02/2022 11:58 AM  Performed by: Gwyndolyn Saxon, CRNAPre-anesthesia Checklist: Patient identified, Emergency Drugs available, Suction available and Patient being monitored Patient Re-evaluated:Patient Re-evaluated prior to induction Oxygen Delivery Method: Circle system utilized Preoxygenation: Pre-oxygenation with 100% oxygen Induction Type: IV induction Ventilation: Mask ventilation without difficulty LMA: LMA inserted LMA Size: 4.0 Number of attempts: 1 Placement Confirmation: positive ETCO2 and breath sounds checked- equal and bilateral Tube secured with: Tape Dental Injury: Teeth and Oropharynx as per pre-operative assessment

## 2022-09-02 NOTE — Discharge Instructions (Addendum)
You may see some blood in the urine and may have some burning with urination for 48-72 hours. You also may notice that you have to urinate more frequently or urgently after your procedure which is normal.  You should call should you develop an inability urinate, fever > 101, persistent nausea and vomiting that prevents you from eating or drinking to stay hydrated.    

## 2022-09-02 NOTE — Anesthesia Postprocedure Evaluation (Signed)
Anesthesia Post Note  Patient: [name], MD  Procedure(s) Performed: CYSTOSCOPY, BALLOON DILATION OF SUPRAPUBIC TUBE SITE, BLADDER BIOPSISES AND FULGERATION (Bladder)     Patient location during evaluation: PACU Anesthesia Type: General Level of consciousness: awake and alert, patient cooperative and oriented Pain management: pain level controlled Vital Signs Assessment: post-procedure vital signs reviewed and stable Respiratory status: spontaneous breathing, nonlabored ventilation and respiratory function stable Cardiovascular status: blood pressure returned to baseline and stable Postop Assessment: no apparent nausea or vomiting, able to ambulate and adequate PO intake Anesthetic complications: no   No notable events documented.  Last Vitals:  Vitals:   09/02/22 1400 09/02/22 1413  BP: 128/66 126/68  Pulse: 66 66  Resp: 17 17  Temp:  36.7 C  SpO2: 95% 94%    Last Pain:  Vitals:   09/02/22 1413  TempSrc: Oral  PainSc: 0-No pain                 Carmine Carrozza,E. Calton Harshfield

## 2022-09-02 NOTE — Interval H&P Note (Signed)
History and Physical Interval Note:  09/02/2022 11:08 AM  [name], MD  has presented today for surgery, with the diagnosis of HEMATURIA, URETHRAL STRICTURE.  The various methods of treatment have been discussed with the patient and family. After consideration of risks, benefits and other options for treatment, the patient has consented to  Procedure(s) with comments: [place] (N/A) - [place] (N/A) as a surgical intervention.  The patient's history has been reviewed, patient examined, no change in status, stable for surgery.  I have reviewed the patient's chart and labs.  Questions were answered to the patient's satisfaction.     [employer]

## 2022-09-02 NOTE — Transfer of Care (Signed)
Immediate Anesthesia Transfer of Care Note  Patient: Steven Bury, MD  Procedure(s) Performed: CYSTOSCOPY, BALLOON DILATION OF SUPRAPUBIC TUBE SITE, BLADDER BIOPSISES AND FULGERATION (Bladder)  Patient Location: PACU  Anesthesia Type:General  Level of Consciousness: drowsy  Airway & Oxygen Therapy: Patient Spontanous Breathing and Patient connected to face mask oxygen  Post-op Assessment: Report given to RN and Post -op Vital signs reviewed and stable  Post vital signs: Reviewed and stable  Last Vitals:  Vitals Value Taken Time  BP 132/58 09/02/22 1307  Temp    Pulse 63 09/02/22 1309  Resp 17 09/02/22 1309  SpO2 99 % 09/02/22 1309  Vitals shown include unvalidated device data.  Last Pain:  Vitals:   09/02/22 0948  TempSrc:   PainSc: 0-No pain         Complications: No notable events documented.

## 2022-09-03 ENCOUNTER — Encounter (HOSPITAL_COMMUNITY): Payer: Self-pay | Admitting: Urology

## 2022-09-03 LAB — SURGICAL PATHOLOGY

## 2022-12-27 ENCOUNTER — Other Ambulatory Visit: Payer: Self-pay

## 2022-12-27 ENCOUNTER — Emergency Department (HOSPITAL_BASED_OUTPATIENT_CLINIC_OR_DEPARTMENT_OTHER): Payer: Medicare PPO

## 2022-12-27 ENCOUNTER — Emergency Department (HOSPITAL_BASED_OUTPATIENT_CLINIC_OR_DEPARTMENT_OTHER)
Admission: EM | Admit: 2022-12-27 | Discharge: 2022-12-27 | Disposition: A | Discharge status: Home / Self Care | Payer: Medicare PPO | Attending: Emergency Medicine | Admitting: Emergency Medicine

## 2022-12-27 ENCOUNTER — Encounter (HOSPITAL_BASED_OUTPATIENT_CLINIC_OR_DEPARTMENT_OTHER): Payer: Self-pay | Admitting: Emergency Medicine

## 2022-12-27 ENCOUNTER — Telehealth: Payer: Self-pay | Admitting: Cardiovascular Disease

## 2022-12-27 DIAGNOSIS — J90 Pleural effusion, not elsewhere classified: Secondary | ICD-10-CM | POA: Diagnosis not present

## 2022-12-27 DIAGNOSIS — Z7901 Long term (current) use of anticoagulants: Secondary | ICD-10-CM | POA: Diagnosis not present

## 2022-12-27 DIAGNOSIS — I2510 Atherosclerotic heart disease of native coronary artery without angina pectoris: Secondary | ICD-10-CM | POA: Diagnosis not present

## 2022-12-27 DIAGNOSIS — R0602 Shortness of breath: Secondary | ICD-10-CM | POA: Diagnosis present

## 2022-12-27 DIAGNOSIS — Z7984 Long term (current) use of oral hypoglycemic drugs: Secondary | ICD-10-CM | POA: Diagnosis not present

## 2022-12-27 DIAGNOSIS — J45909 Unspecified asthma, uncomplicated: Secondary | ICD-10-CM | POA: Insufficient documentation

## 2022-12-27 DIAGNOSIS — Z951 Presence of aortocoronary bypass graft: Secondary | ICD-10-CM | POA: Insufficient documentation

## 2022-12-27 DIAGNOSIS — I4891 Unspecified atrial fibrillation: Secondary | ICD-10-CM | POA: Diagnosis not present

## 2022-12-27 LAB — CBC WITH DIFFERENTIAL/PLATELET
Abs Immature Granulocytes: 0.02 10*3/uL (ref 0.00–0.07)
Basophils Absolute: 0 10*3/uL (ref 0.0–0.1)
Basophils Relative: 1 %
Eosinophils Absolute: 0.2 10*3/uL (ref 0.0–0.5)
Eosinophils Relative: 3 %
HCT: 39.9 % (ref 39.0–52.0)
Hemoglobin: 12.8 g/dL — ABNORMAL LOW (ref 13.0–17.0)
Immature Granulocytes: 0 %
Lymphocytes Relative: 11 %
Lymphs Abs: 0.9 10*3/uL (ref 0.7–4.0)
MCH: 29.7 pg (ref 26.0–34.0)
MCHC: 32.1 g/dL (ref 30.0–36.0)
MCV: 92.6 fL (ref 80.0–100.0)
Monocytes Absolute: 0.9 10*3/uL (ref 0.1–1.0)
Monocytes Relative: 12 %
Neutro Abs: 6.1 10*3/uL (ref 1.7–7.7)
Neutrophils Relative %: 73 %
Platelets: 157 10*3/uL (ref 150–400)
RBC: 4.31 MIL/uL (ref 4.22–5.81)
RDW: 14 % (ref 11.5–15.5)
WBC: 8.1 10*3/uL (ref 4.0–10.5)
nRBC: 0 % (ref 0.0–0.2)

## 2022-12-27 LAB — BASIC METABOLIC PANEL
Anion gap: 7 (ref 5–15)
BUN: 15 mg/dL (ref 8–23)
CO2: 23 mmol/L (ref 22–32)
Calcium: 9 mg/dL (ref 8.9–10.3)
Chloride: 110 mmol/L (ref 98–111)
Creatinine, Ser: 0.88 mg/dL (ref 0.61–1.24)
GFR, Estimated: >60 mL/min (ref >60)
Glucose, Bld: 127 mg/dL — ABNORMAL HIGH (ref 70–99)
Potassium: 4.3 mmol/L (ref 3.5–5.1)
Sodium: 140 mmol/L (ref 135–145)

## 2022-12-27 LAB — PROTIME-INR
INR: 2.3 — ABNORMAL HIGH (ref 0.8–1.2)
Prothrombin Time: 25.4 seconds — ABNORMAL HIGH (ref 11.4–15.2)

## 2022-12-27 LAB — D-DIMER, QUANTITATIVE: D-Dimer, Quant: 0.37 ug/mL-FEU (ref 0.00–0.50)

## 2022-12-27 LAB — BRAIN NATRIURETIC PEPTIDE: B Natriuretic Peptide: 240.1 pg/mL — ABNORMAL HIGH (ref 0.0–100.0)

## 2022-12-27 LAB — TROPONIN I (HIGH SENSITIVITY): Troponin I (High Sensitivity): 13 ng/L (ref <18)

## 2022-12-27 MED ORDER — ALBUTEROL SULFATE HFA 108 (90 BASE) MCG/ACT IN AERS: 2.0000 | INHALATION_SPRAY | RESPIRATORY_TRACT | Status: DC | PRN

## 2022-12-27 MED ORDER — FUROSEMIDE 10 MG/ML IJ SOLN
40.0000 mg | Freq: Once | INTRAMUSCULAR | Status: AC
  Administered 2022-12-27: 40 mg via INTRAVENOUS
  Filled 2022-12-27: qty 4

## 2022-12-27 MED ORDER — FUROSEMIDE 40 MG PO TABS: 40.0000 mg | ORAL_TABLET | Freq: Every day | ORAL | 0 refills | Status: DC

## 2022-12-27 MED ORDER — IPRATROPIUM-ALBUTEROL 0.5-2.5 (3) MG/3ML IN SOLN
3.0000 mL | Freq: Once | RESPIRATORY_TRACT | Status: AC
  Administered 2022-12-27: 3 mL via RESPIRATORY_TRACT
  Filled 2022-12-27: qty 3

## 2022-12-27 NOTE — Telephone Encounter (Signed)
Spoke with DOD (Dr. Royann Shivers) - most concerning for DVT/PE situation. Advised urgent care or [facility] eval.   Called patient and relayed this information. Provided him address to [facility]. Advised he go on today.

## 2022-12-27 NOTE — Telephone Encounter (Signed)
Pt c/o Shortness Of Breath: STAT if SOB developed within the last 24 hours or pt is noticeably SOB on the phone  1. Are you currently SOB (can you hear that pt is SOB on the phone)?   Patient states with exertion  2. How long have you been experiencing SOB?   Ongoing since Wed. 4/3  3. Are you SOB when sitting or when up moving around?   When sitting and when moving around  4. Are you currently experiencing any other symptoms? No   Patient stated he also has asthma.  Patient states his BP and Oxygen are good and he is negative for COVID.  Patient states he has a history of afib but no afib at time of call.

## 2022-12-27 NOTE — ED Provider Notes (Signed)
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 He states that he has been wheezing more,  coughing, increased shortness of breath refractory to inhaler, and a 6 lb weight gain in past week. Denies any chest pain or swelling of abdomen. No hemoptysis. Is concerned about a PE as he has recently flown back from [place] this week. Has been compliant with xareloto. Does not take any diuretics at home per patient.     Home Medications Prior to Admission medications   Medication Sig Start Date End Date Taking? Authorizing Provider  furosemide (LASIX) 40 MG tablet Take 1 tablet (40 mg total) by mouth daily for 5 days. 12/27/22 01/01/23 Yes Britteney Ayotte L, PA  bimatoprost (LUMIGAN) 0.01 % SOLN Place 1 drop into both eyes at bedtime.    [provider]  folic acid (FOLVITE) 1 MG tablet Take 1 mg by mouth daily. 02/07/22   [provider]  losartan (COZAAR) 50 MG tablet Take 50 mg by mouth daily.    [provider]  metFORMIN (GLUCOPHAGE) 500 MG tablet Take 500 mg by mouth daily with breakfast.    [provider]  metoprolol tartrate (LOPRESSOR) 25 MG tablet Take 12.5 mg by mouth 2 (two) times daily. 09/20/20 09/02/22  [provider]  potassium chloride SA (KLOR-CON) 20 MEQ tablet Take 20 mEq by mouth daily.    [provider]  rosuvastatin (CRESTOR) 40 MG tablet Take 1 tablet (40 mg total) by mouth daily. Patient taking differently: Take 40 mg by mouth at bedtime. 03/07/21 09/02/22  Iran Ouch, MD  TURMERIC PO Take 1,000 mg by mouth daily.    [provider]      Allergies     Patient has no known allergies.    Review of Systems   Review of Systems  Constitutional:  Negative for fever.  Respiratory:  Positive for cough and shortness of breath.     Physical Exam Updated Vital Signs BP 138/74   Pulse (!) 59   Temp 98.7 F (37.1 C) (Oral)   Resp (!) 27   SpO2 98%  Physical Exam Vitals and nursing note reviewed.  Constitutional:      General: He is not in acute distress.    Appearance: He is well-developed.  HENT:     Head: Normocephalic and atraumatic.  Eyes:     Conjunctiva/sclera: Conjunctivae normal.  Cardiovascular:     Rate and Rhythm: Normal rate and regular rhythm.     Heart sounds: No murmur heard. Pulmonary:     Effort: Pulmonary effort is normal. No respiratory distress.     Breath sounds: Decreased breath sounds present.  Abdominal:     Palpations: Abdomen is soft.     Tenderness: There is no abdominal tenderness.  Musculoskeletal:        General: No swelling.     Cervical back: Neck supple.  Skin:    General: Skin is warm and dry.     Capillary Refill: Capillary refill takes less than 2 seconds.  Neurological:     Mental Status: He is alert.  Psychiatric:        Mood and Affect: Mood normal.     ED Results / Procedures / Treatments   Labs (all labs ordered are listed, but only abnormal results are displayed) Labs Reviewed  CBC WITH DIFFERENTIAL/PLATELET - Abnormal; Notable for the following components:      Result Value   Hemoglobin 12.8 (*)    All other components within normal limits  BASIC METABOLIC PANEL - Abnormal; Notable for the following components:   Glucose, Bld 127 (*)    All other components within normal limits  PROTIME-INR - Abnormal; Notable for the following components:   Prothrombin Time 25.4 (*)    INR 2.3 (*)    All other components within normal limits  BRAIN NATRIURETIC PEPTIDE - Abnormal; Notable for the following components:   B Natriuretic Peptide 240.1 (*)    All other components within  normal limits  D-DIMER, QUANTITATIVE  TROPONIN I (HIGH SENSITIVITY)  TROPONIN I (HIGH SENSITIVITY)    EKG EKG Interpretation  Date/Time:  Friday December 27 2022 18:12:43 EDT Ventricular Rate:  62 PR Interval:    QRS Duration: 86 QT Interval:  418 QTC Calculation: 425 R Axis:   43 Text Interpretation: sinus rhythm with first degree block Low voltage, precordial leads Probable anteroseptal infarct, old Confirmed by Vivien RossettiBranham, Victoria ([id number]) on 12/27/2022 6:24:29 PM  Radiology DG Chest Port 1 View  Result Date: 12/27/2022 CLINICAL DATA:  Shortness of breath. EXAM: PORTABLE CHEST 1 VIEW COMPARISON:  June 11, 2019 FINDINGS: Multiple sternal wires and vascular clips are noted. The heart size and mediastinal contours are within normal limits. Mild, diffuse, chronic appearing increased lung markings are seen. There is no evidence of an acute infiltrate or pneumothorax. There is mild blunting of the left costophrenic angle. Multilevel degenerative changes are seen throughout the thoracic spine. IMPRESSION: 1. Evidence of prior median sternotomy/CABG. 2. Dlisa Barnwell left pleural effusion versus pleural thickening. Electronically Signed   By: Aram Candelahaddeus  Houston M.D.   On: 12/27/2022 18:34    Procedures Procedures    Medications Ordered in ED Medications  albuterol (VENTOLIN HFA) 108 (90 Base) MCG/ACT inhaler 2 puff (has no administration in time range)  ipratropium-albuterol (DUONEB) 0.5-2.5 (3) MG/3ML nebulizer solution 3 mL (3 mLs Nebulization Given 12/27/22 1826)  furosemide (LASIX) injection 40 mg (40 mg Intravenous Given 12/27/22 1858)    ED Course/ Medical Decision Making/ A&P                             Medical Decision Making Patient is a 77 year old male, reports increased shortness of breath at rest and on exertion for the last 3 days.  Has been using his albuterol inhaler without relief.  He has decreased breath sounds, but is on normal room air.  We will walk the patient, obtain chest  x-ray, BNP, troponin, dimer as he is low risk for PE.  Has been compliant with Xarelto.  Amount and/or Complexity of Data Reviewed Labs: ordered.    Details: Elevated BNP, normal troponin Radiology: ordered.    Details: Chest x-ray shows left-sided pleural effusion ECG/medicine tests:  Decision-making details documented in ED Course. Discussion of management or test interpretation with external provider(s): Discussed with patient, is O2 while walking was consistently at 94 to 95%.  He is in no distress.  Lung sounds improved after 40 IV Lasix.  We will put him on p.o. Lasix, for 5 days, and have him follow-up with his  cardiologist and primary care doctor.  He will need to have his electrolytes checked for this, just to evaluate his potassium as well as I recommend an outpatient echocardiogram.  He is in agreement with the plan.  Has close follow-up with his cardiologist.  Discussed return precautions and he voiced understanding.  His symptoms most likely align with some aspect of CHF.  He has been on Lasix before, but is not on any Lasix as of now.  Risk Prescription drug management.    Final Clinical Impression(s) / ED Diagnoses Final diagnoses:  Pleural effusion on left    Rx / DC Orders ED Discharge Orders          Ordered    furosemide (LASIX) 40 MG tablet  Daily        12/27/22 2041              Pete PeltSmall, Raeann Offner L, GeorgiaPA 12/27/22 2044    Mardene SayerBranham, Victoria C, MD 12/27/22 [phone]

## 2022-12-27 NOTE — Discharge Instructions (Addendum)
Please take the furosemide in the morning, as this will help you pee.  If you have worsening shortness of breath chest discomfort, return to the ER.  I recommend that you follow-up with your primary care doctor in 1 week, and your cardiologist, you will need to have your labs rechecked in, and I recommend an outpatient echocardiogram.

## 2022-12-27 NOTE — Telephone Encounter (Signed)
Spoke with Dr. Corine Shelter. Patient reports SOB since Wednesday. He reports he went to [place] last week, he noticed his asthma was a little more reactive - used albuterol several times - not on an ICS. He reports shortness of breath while walking thru the airport. Last night he was unable to sleep well d/t asthma. He reports "chest symptoms", like he felt like he was drowning - this was episodic, lasting several minutes. He reports he was able to lay down flat without this sensation this morning. He went to urologist today and after walking about 10-15 feet he got short-winded, but short-lived.   He has stage 2 CKD. He had been on lasix previously (20mg  BID) - but nephrologist stopped this about 1 month ago. He reports weight gain but was not eating well on vacation.   Vitals: Pulse ox 95% Pulse regular, "felt" fluttering, Kardia negative - 60s 122/80

## 2022-12-27 NOTE — ED Triage Notes (Addendum)
Pt  presents to ED POV. Pt c/o SOB since Wednesday. Pt reports that SOB worsens w/ exertion. Pt reports no hx of PE. Take xarelto d/t CABG hx. Pt reports using his home inhalers w/o relief. Sent here for r/o PE

## 2022-12-31 NOTE — Progress Notes (Deleted)
Cardiology Clinic Note   Patient Name: Steven [name] B Tegethoff, MD Date of Encounter: 12/31/2022  Primary Care Provider:  [name], Mobolaji B, MD Primary Cardiologist:  [name], MD  Patient Profile    77 year old male with history of coronary artery disease, status post CABG x 3, October 2021 (LIMA to LAD, radial to OM 2, SVG to OM 3, and distal RCA.  (Additionally pulmonary vein ablation and left atrial appendage clipping), by Dr[name]; PAF on Xarelto, CHA2DS2-VASc score 7 (Age HTN, DM, TIA, CAD), hyperlipidemia, hypertension, & TIA. With on cardiac history of type 2 diabetes mellitus, prostate cancer.   Past Medical History    Past Medical History:  Diagnosis Date   Cancer    Carcinoma in situ of prostate    Coronary artery disease    Diabetes mellitus without complication    Glaucoma 2019   History of transient ischemic attack (TIA)    Hyperlipidemia    Hypertension    Ischemia    Obesity    PAF (paroxysmal atrial fibrillation)    Palpitations    Prostate CA    SOB (shortness of breath)    Past Surgical History:  Procedure Laterality Date   BALLOON DILATION N/A 09/02/2022   Procedure: CYSTOSCOPY, BALLOON DILATION OF SUPRAPUBIC TUBE SITE, BLADDER BIOPSISES AND FULGERATION;  Surgeon: Heloise PurpuraBorden, Lester, MD;  Location: [facility];  Service: Urology;  Laterality: N/A;  6460 MINUTES NEEDED FOR CASE   CARDIAC CATHETERIZATION  06/2020   At [facility]: Three-vessel coronary artery disease with 80% calcified diffuse mid LAD disease, 60% ostial left circumflex, 70% stenosis in OM 3 and OM 2 and occluded mid RCA   CORONARY ARTERY BYPASS GRAFT  07/2020    CABG by Dr. Romona CurlsMilano with LIMA to LAD, radial to OM 2, SVG to OM 3 and distal RCA.  In addition, he underwent pulmonary vein ablation and left atrial appendage clipping.   IR CATHETER TUBE CHANGE  01/13/2018   PROSTATE SURGERY  10/2016   Seeds implanted    Allergies  No Known Allergies  History of Present  Illness    Mr. Steven [name] presents today for ongoing assessment management of coronary artery disease with history of CABG as outlined above, hypertension, hyperlipidemia, TIA, PAF on Xarelto CHA2DS2-VASc score of 7, with noncardiac history of diabetes.  Last seen in the office by Dr. Kirke CorinArida on 02/26/2022.  At that time LDL was not at goal and he was changed from atorvastatin to rosuvastatin 40 mg daily.  He was to follow-up in 1 year.   Was seen in the emergency room on 12/27/2022 with complaints of shortness of breath on exertion and wheezing.  He also noted a 6 pound weight gain.  [place] [facility] [place] [facility]  He was treated with Benylin inhaler and furosemide 40 mg IV.  He was ruled out for PE.  Chest x-ray revealed left-sided pleural effusion.  He was recommended to follow-up with cardiology and have an outpatient echocardiogram.  He was started on Lasix 40 mg daily.  Of note most recent echocardiogram on 05/11/2019 revealed normal LVEF of 60 to 65%.  Home Medications    Current Outpatient Medications  Medication Sig Dispense Refill   bimatoprost (LUMIGAN) 0.01 % SOLN Place 1 drop into both eyes at bedtime.     folic acid (FOLVITE) 1 MG tablet Take 1 mg by mouth daily.     furosemide (LASIX) 40 MG tablet Take 1 tablet (40 mg total) by  mouth daily for 5 days. 5 tablet 0   losartan (COZAAR) 50 MG tablet Take 50 mg by mouth daily.     metFORMIN (GLUCOPHAGE) 500 MG tablet Take 500 mg by mouth daily with breakfast.     metoprolol tartrate (LOPRESSOR) 25 MG tablet Take 12.5 mg by mouth 2 (two) times daily.     potassium chloride SA (KLOR-CON) 20 MEQ tablet Take 20 mEq by mouth daily.     rosuvastatin (CRESTOR) 40 MG tablet Take 1 tablet (40 mg total) by mouth daily. (Patient taking differently: Take 40 mg by mouth at bedtime.) 90 tablet 3   TURMERIC PO Take 1,000 mg by mouth daily.     No current facility-administered medications for this visit.     Family History    Family History   Problem Relation Age of Onset   Breast cancer Mother    Diabetes Father    Stroke Father    He indicated that his mother is deceased. He indicated that his father is deceased.  Social History    Social History   Socioeconomic History   Marital status: Married    Spouse name: Not on file   Number of children: 3   Years of education: Not on file   Highest education level: Professional school degree (e.g., MD, DDS, DVM, JD)  Occupational History   Occupation: Retired Physician  Tobacco Use   Smoking status: Never   Smokeless tobacco: Never  Vaping Use   Vaping Use: Never used  Substance and Sexual Activity   Alcohol use: Yes    Comment: socially on occasion   Drug use: Never   Sexual activity: Not Currently  Other Topics Concern   Not on file  Social History Narrative   Not on file   Social Determinants of Health   Financial Resource Strain: Not on file  Food Insecurity: Not on file  Transportation Needs: Not on file  Physical Activity: Not on file  Stress: Not on file  Social Connections: Not on file  Intimate Partner Violence: Not on file     Review of Systems    General:  No chills, fever, night sweats or weight changes.  Cardiovascular:  No chest pain, dyspnea on exertion, edema, orthopnea, palpitations, paroxysmal nocturnal dyspnea. Dermatological: No rash, lesions/masses Respiratory: No cough, dyspnea Urologic: No hematuria, dysuria Abdominal:   No nausea, vomiting, diarrhea, bright red blood per rectum, melena, or hematemesis Neurologic:  No visual changes, wkns, changes in mental status. All other systems reviewed and are otherwise negative except as noted above.     Physical Exam    VS:  There were no vitals taken for this visit. , BMI There is no height or weight on file to calculate BMI.     GEN: Well nourished, well developed, in no acute distress. HEENT: normal. Neck: Supple, no JVD, carotid bruits, or masses. Cardiac: RRR, no murmurs,  rubs, or gallops. No clubbing, cyanosis, edema.  Radials/DP/PT 2+ and equal bilaterally.  Respiratory:  Respirations regular and unlabored, clear to auscultation bilaterally. GI: Soft, nontender, nondistended, BS + x 4. MS: no deformity or atrophy. Skin: warm and dry, no rash. Neuro:  Strength and sensation are intact. Psych: Normal affect.  Accessory Clinical Findings    ECG personally reviewed by me today- *** - No acute changes  Lab Results  Component Value Date   WBC 8.1 12/27/2022   HGB 12.8 (L) 12/27/2022   HCT 39.9 12/27/2022   MCV 92.6 12/27/2022   PLT  157 12/27/2022   Lab Results  Component Value Date   CREATININE 0.88 12/27/2022   BUN 15 12/27/2022   NA 140 12/27/2022   K 4.3 12/27/2022   CL 110 12/27/2022   CO2 23 12/27/2022   Lab Results  Component Value Date   ALT 13 03/05/2021   AST 16 03/05/2021   ALKPHOS 131 (H) 03/05/2021   BILITOT 0.7 03/05/2021   Lab Results  Component Value Date   CHOL 163 03/05/2021   HDL 67 03/05/2021   LDLCALC 81 03/05/2021   TRIG 79 03/05/2021   CHOLHDL 2.4 03/05/2021    No results found for: "HGBA1C"  Review of Prior Studies:  Echocardiogram 05/11/2019 1. The left ventricle has normal systolic function with an ejection  fraction of 60-65%. The cavity size was normal. Mild basal septal  hypertrophy. Left ventricular diastolic function could not be evaluated  due to indeterminate diastolic function.   2. The average left ventricular global longitudinal strain is -20.2 %.   3. The right ventricle has normal systolic function. The cavity was  normal. There is no increase in right ventricular wall thickness.   4. There is mild mitral annular calcification present.   5. The aortic valve is tricuspid. Mild sclerosis of the aortic valve.   6. The aorta is normal unless otherwise noted.   Cardiac CTA 05/31/2020 1. Coronary calcium score of 2967. This was 97th percentile for age and sex matched control.   2. Normal coronary  origin with right dominance.   3. Multivessel Calcific disease including the Left Main; CAD-RADS 4 Severe stenosis. (70-99% or > 50% left main). CT FFR is sent. Consider symptom-guided anti-ischemic pharmacotherapy as well as risk factor modification per guideline directed care.    Assessment & Plan   1.  ***     {Are you ordering a CV Procedure (e.g. stress test, cath, DCCV, TEE, etc)?   Press F2        :[id number]}   Signed, Bettey Mare. Liborio Nixon, ANP, AACC   12/31/2022 1:29 PM      Office [phone] Fax [phone]  Notice: This dictation was prepared with Dragon dictation along with smaller phrase technology. Any transcriptional errors that result from this process are unintentional and may not be corrected upon review.

## 2023-01-03 ENCOUNTER — Ambulatory Visit: Payer: Medicare PPO | Admitting: Adult Health

## 2023-01-08 ENCOUNTER — Encounter: Payer: Self-pay | Admitting: Student

## 2023-01-08 ENCOUNTER — Ambulatory Visit: Payer: Medicare PPO | Attending: Adult Health | Admitting: Student

## 2023-01-08 VITALS — BP 116/64 | HR 71 | Ht 66.0 in | Wt 211.4 lb

## 2023-01-08 DIAGNOSIS — I10 Essential (primary) hypertension: Secondary | ICD-10-CM

## 2023-01-08 DIAGNOSIS — R0609 Other forms of dyspnea: Secondary | ICD-10-CM | POA: Diagnosis not present

## 2023-01-08 DIAGNOSIS — Z951 Presence of aortocoronary bypass graft: Secondary | ICD-10-CM | POA: Diagnosis not present

## 2023-01-08 DIAGNOSIS — I480 Paroxysmal atrial fibrillation: Secondary | ICD-10-CM

## 2023-01-08 DIAGNOSIS — E118 Type 2 diabetes mellitus with unspecified complications: Secondary | ICD-10-CM

## 2023-01-08 DIAGNOSIS — I2510 Atherosclerotic heart disease of native coronary artery without angina pectoris: Secondary | ICD-10-CM

## 2023-01-08 DIAGNOSIS — E785 Hyperlipidemia, unspecified: Secondary | ICD-10-CM

## 2023-01-08 DIAGNOSIS — R5383 Other fatigue: Secondary | ICD-10-CM | POA: Diagnosis not present

## 2023-01-08 NOTE — Progress Notes (Signed)
Cardiology Office Note:    Date:  01/08/2023   ID:  Steven Lex, Steven [name], DOB March 12, 1946, MRN [id number]  PCP:  [place], Steven [name]  Cardiologist:  Lorine Bears, Steven [name]  Electrophysiologist:  None   Referring Steven [name]: [place], Steven [name]   Chief Complaint: dyspnea on exertion  History of Present Illness:    Steven Lex, Steven [name] is a 77 y.o. male with a history of CAD s/p CABG x5  in 07/2020 at [facility], paroxysmal atrial fibrillation s/p pulmonary vein ablation and left atrial appendage clipping at time of CABG on Xarelto, hypertension, hyperlipidemia, type 2 diabetes mellitus, obesity, and prostate cancer who is followed by Dr. Kirke Corin and presents today for follow-up of shortness of breath.  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 He had a LHC done at [facility] in 06/2020 for evaluation of severe exertional dyspnea and was found to have diffuse three vessel CAD. He ultimately underwent CABG x5 with LIMA to LAD, radial to OM2, SVG to OM3, and SVG to distal RCA in 07/2020 with Dr. Romona Curls. He also underwent pulmonary vein ablation and left atrial appendage clipping at that time given his history of atrial fibrillation. He had some post-op bradycardia and beta-blocker had to be decreased.  He was last seen by Dr. Kirke Corin in 02/2022 at which time he was doing well from a cardiac standpoint.  He was recently seen in the ED on 12/27/2022 for further evaluation of shortness of breath at rest and with exertion. He also reported wheezing, coughing, and a 6lb weight gain in the past week. He was concerned about a PE given he had recently flown back from [place] earlier that week; however, he reported compliance with his Xarelto. EKG showed normal sinus rhythm with no acute ischemic changes. High-sensitivity troponin and D-dimer negative. BNP mildly elevated at 240. Chest x-ray showed a small left pleural  effusion vs pleural thickening. He was given 1 dose of IV Lasix and an albuterol treatment with improvement in symptoms. He was then discharged with a 5 day course of PO Lasix.  Patient presents today for follow-up. He states that for the last week he has just felt "ancient." He reports feeling very fatigued and states he will wake up feeling tired. He also describes decreased exercise tolerance and dyspnea on exertion such as when walking up the stairs. He states this is new since getting back from [place].  He denies any chest pain or shortness of breath at rest or when walking on a flat surface. No orthopnea or edema. He reports occasional episodes of lightheadedness with position changes. However, he denies any palpitations, falls, or syncope. He also reports a 10 lbs weight loss over the past month but attributes this to taking Golo.  Past Medical History:  Diagnosis Date   Cancer    Carcinoma in situ of prostate    Coronary artery disease    Diabetes mellitus without complication    Glaucoma 2019   History of transient ischemic attack (TIA)    Hyperlipidemia    Hypertension    Ischemia    Obesity    PAF (paroxysmal atrial fibrillation)    Palpitations    Prostate CA    SOB (shortness of breath)     Past Surgical History:  Procedure Laterality Date   BALLOON DILATION N/A 09/02/2022   Procedure: CYSTOSCOPY, BALLOON DILATION OF  SUPRAPUBIC TUBE SITE, BLADDER BIOPSISES AND FULGERATION;  Surgeon: Heloise Purpura, Steven [name];  Location: [facility];  Service: Urology;  Laterality: N/A;  58 MINUTES NEEDED FOR CASE   CARDIAC CATHETERIZATION  06/2020   At [facility]: Three-vessel coronary artery disease with 80% calcified diffuse mid LAD disease, 60% ostial left circumflex, 70% stenosis in OM 3 and OM 2 and occluded mid RCA   CORONARY ARTERY BYPASS GRAFT  07/2020    CABG by Dr. Romona Curls with LIMA to LAD, radial to OM 2, SVG to OM 3 and distal RCA.  In addition, he underwent pulmonary vein ablation and left atrial  appendage clipping.   IR CATHETER TUBE CHANGE  01/13/2018   PROSTATE SURGERY  10/2016   Seeds implanted    Current Medications: Current Meds  Medication Sig   albuterol (VENTOLIN HFA) 108 (90 Base) MCG/ACT inhaler Inhale into the lungs as needed.   bimatoprost (LUMIGAN) 0.01 % SOLN Place 1 drop into both eyes at bedtime.   folic acid (FOLVITE) 1 MG tablet Take 1 mg by mouth daily.   losartan (COZAAR) 50 MG tablet Take 50 mg by mouth daily.   metFORMIN (GLUCOPHAGE) 500 MG tablet Take 500 mg by mouth daily with breakfast.   metoprolol tartrate (LOPRESSOR) 25 MG tablet Take 12.5 mg by mouth 2 (two) times daily.   potassium chloride SA (KLOR-CON) 20 MEQ tablet Take 20 mEq by mouth daily.   XARELTO 20 MG TABS tablet Take 20 mg by mouth daily.   [DISCONTINUED] TURMERIC PO Take 1,000 mg by mouth daily.     Allergies:   Patient has no known allergies.   Social History   Socioeconomic History   Marital status: Married    Spouse name: Not on file   Number of children: 3   Years of education: Not on file   Highest education level: Professional school degree (e.g., Steven [name], DDS, DVM, JD)  Occupational History   Occupation: Retired Physician  Tobacco Use   Smoking status: Never   Smokeless tobacco: Never  Vaping Use   Vaping Use: Never used  Substance and Sexual Activity   Alcohol use: Yes    Comment: socially on occasion   Drug use: Never   Sexual activity: Not Currently  Other Topics Concern   Not on file  Social History Narrative   Not on file   Social Determinants of Health   Financial Resource Strain: Not on file  Food Insecurity: Not on file  Transportation Needs: Not on file  Physical Activity: Not on file  Stress: Not on file  Social Connections: Not on file     Family History: The patient's family history includes Breast cancer in his mother; Diabetes in his father; Stroke in his father.  ROS:   Please see the history of present illness.     EKGs/Labs/Other  Studies Reviewed:    The following studies were reviewed:  Echocardiogram 05/11/2019: Impressions: 1. The left ventricle has normal systolic function with an ejection  fraction of 60-65%. The cavity size was normal. Mild basal septal  hypertrophy. Left ventricular diastolic function could not be evaluated  due to indeterminate diastolic function.   2. The average left ventricular global longitudinal strain is -20.2 %.   3. The right ventricle has normal systolic function. The cavity was  normal. There is no increase in right ventricular wall thickness.   4. There is mild mitral annular calcification present.   5. The aortic valve is tricuspid. Mild sclerosis of the aortic valve.  6. The aorta is normal unless otherwise noted.  _______________  Coronary CTA 06/20/2010:  Impressions: 1. Coronary calcium score of 2967. This was 97th percentile for age and sex matched control. 2. Normal coronary origin with right dominance. 3. Multivessel Calcific disease including the Left Main; CAD-RADS 4 Severe stenosis. (70-99% or > 50% left main). CT FFR is sent. Consider symptom-guided anti-ischemic pharmacotherapy as well as risk factor modification per guideline directed care. _______________  Left Cardiac Catheterization 07/17/2020 ([facility]): Impressions: 3 vessel coronary artery disease.  Severe calcified left coronary disease (mid LAD, D1, OM1, OM3)  Small RCA without well visualized collaterals   iFR abnormal of LAD  iFR borderline-abnormal of LCx   Recommendations: Risk factor modification and secondary prevention measures  Aggressive medical therapy for CAD  Discuss CABG as option for treatment, due to severely calcified arteries and multiple locations  Routine post-cath care; sheath out, TR band applied  Continue with ASA and high-intensity statin  Will discuss with Dr. Kirke Corin    EKG:  EKG not ordered today.   Recent Labs: 12/27/2022: B Natriuretic Peptide 240.1; BUN 15;  Creatinine, Ser 0.88; Hemoglobin 12.8; Platelets 157; Potassium 4.3; Sodium 140  Recent Lipid Panel    Component Value Date/Time   CHOL 163 03/05/2021 1153   TRIG 79 03/05/2021 1153   HDL 67 03/05/2021 1153   CHOLHDL 2.4 03/05/2021 1153   LDLCALC 81 03/05/2021 1153    Physical Exam:    Vital Signs: BP 116/64   Pulse 71   Ht 5\' 6"  (1.676 m)   Wt 211 lb 6.4 oz (95.9 kg)   SpO2 95%   BMI 34.12 kg/m     Wt Readings from Last 3 Encounters:  01/08/23 211 lb 6.4 oz (95.9 kg)  09/02/22 216 lb 0.8 oz (98 kg)  08/21/22 218 lb (98.9 kg)     General: 77 y.o. male in no acute distress. HEENT: Normocephalic and atraumatic. Sclera clear.  Neck: Supple. No carotid bruits. No JVD. Heart: RRR. Distinct S1 and S2. No murmurs, gallops, or rubs.  Lungs: No increased work of breathing. Clear to ausculation bilaterally. No wheezes, rhonchi, or rales.  Abdomen: Soft, non-distended, and non-tender to palpation. MSK: Normal strength and tone for age.  Extremities: No lower extremity edema.    Skin: Warm and dry. Neuro: Alert and oriented x3. No focal deficits. Psych: Normal affect. Responds appropriately.   Assessment:    1. DOE (dyspnea on exertion)   2. Fatigue, unspecified type   3. Coronary artery disease involving native coronary artery of native heart without angina pectoris   4. S/P CABG x 5   5. Paroxysmal atrial fibrillation   6. Primary hypertension   7. Hyperlipidemia, unspecified hyperlipidemia type   8. Type 2 diabetes mellitus with complication, without long-term current use of insulin     Plan:    Dyspnea on Exertion  Fatigue Patient was seen in the ED  on 12/27/2022 for dyspnea on 12/27/2023. EKG showed no acute ischemic changes. High-sensitivity troponin and D-dimer were negative. BNP was mildly elevated at 240.1. Chest x-ray showed a small left pleural effusion vs pleural thickening. He was given a dose of IV Lasix and albuterol with improvement and then discharged on a 5  day course of PO Lasix. He continued to have dyspnea when walking up stairs and decreased exercise tolerance/ fatigue.  - Euvolemic on exam. No other signs of CHF. Will hold off on additional Lasix for now. - Will check TSH to rule out  thyroid issues as cause of fatigue.  - Will order Echo.  - Will order cardiac PET stress test to rule out dyspnea and fatigue as an anginal equivalent.   Shared Decision Making/Informed Consent The risks [chest pain, shortness of breath, cardiac arrhythmias, dizziness, blood pressure fluctuations, myocardial infarction, stroke/transient ischemic attack, nausea, vomiting, allergic reaction, radiation exposure, metallic taste sensation and life-threatening complications (estimated to be 1 in 10,000)], benefits (risk stratification, diagnosing coronary artery disease, treatment guidance) and alternatives of a cardiac PET stress test were discussed in detail with Mr. Simonian and he agrees to proceed.  CAD s/p CABG S/p CABG x5 in 07/2020. - No chest pain but recent dyspnea on exertion and decreased exercise tolerance/ fatigue as above. - No aspirin given full anticoagulation. - Continue statin. - Will order cardiac PET as above.  Paroxysmal Atrial Fibrillation S/p pulmonary vein ablation and left atrial appendage clipping in 07/2020 at time of CABG.  - Maintaining sinus rhythm on exam.  - Continue Lopressor 12.5mg  twice daily.  - Continue Xarelto  daily.   Hypertension BP well controlled.  - Continue Lopressor 12.5mg  twice daily and Losartan  daily.   Hyperlipidemia Last LDL in our system was 81 in 02/2021. LDL goal <70 given CAD. - Continue Crestor  daily. - Needs updated labs. Did not have time to discuss today. Can discuss at follow-up visit.   Type 2 Diabetes Mellitus - On Metformin.  - Management per PCP.   Disposition: Follow up in 6 months. If Echo or stress test are abnormal, will need to be seen sooner.    Medication  Adjustments/Labs and Tests Ordered: Current medicines are reviewed at length with the patient today.  Concerns regarding medicines are outlined above.  Orders Placed This Encounter  Procedures   NM PET CT CARDIAC PERFUSION MULTI W/ABSOLUTE BLOODFLOW   TSH   ECHOCARDIOGRAM COMPLETE   No orders of the defined types were placed in this encounter.   Patient Instructions  Medication Instructions:   Your physician recommends that you continue on your current medications as directed. Please refer to the Current Medication list given to you today.  *If you need a refill on your cardiac medications before your next appointment, please call your pharmacy*  Lab Work: Marjie Skiff, PA-C recommends that you have lab work TODAY:  TSH  If you have labs (blood work) drawn today and your tests are completely normal, you will receive your results only by: MyChart Message (if you have MyChart) OR A paper copy in the mail If you have any lab test that is abnormal or we need to change your treatment, we will call you to review the results.   Testing/Procedures: Your physician has requested that you have an echocardiogram. Echocardiography is a painless test that uses sound waves to create images of your heart. It provides your doctor with information about the size and shape of your heart and how well your heart's chambers and valves are working. This procedure takes approximately one hour. There are no restrictions for this procedure. Please do NOT wear cologne, perfume, aftershave, or lotions (deodorant is allowed). Please arrive 15 minutes prior to your appointment time.  CARDIAC PET- Your physician has requested that you have a Cardiac Pet Stress Test. This testing is completed at [facility] ([address] [place], [place] [address]). The schedulers will call you to get this scheduled. Please follow instructions below and call the office with any questions/concerns  [phone]).   Follow-Up: At [facility], you and your health needs are our priority.  As part of our continuing mission to provide you with exceptional heart care, we have created designated Provider Care Teams.  These Care Teams include your primary Cardiologist (physician) and Advanced Practice Providers (APPs -  Physician Assistants and Nurse Practitioners) who all work together to provide you with the care you need, when you need it.  Your next appointment:   6 month(s)  Provider:   Lorine Bears, Steven [name]  or Marjie Skiff, PA-C        Other Instructions  How to Prepare for Your Cardiac PET/CT Stress Test:  1. Please do not take these medications before your test:   Medications that may interfere with the cardiac pharmacological stress agent (ex. nitrates - including erectile dysfunction medications, isosorbide mononitrate, tamulosin or beta-blockers) the day of the exam. (Erectile dysfunction medication should be held for at least 72 hrs prior to test) Theophylline containing medications for 12 hours. Dipyridamole 48 hours prior to the test. Your remaining medications may be taken with water.  2. Nothing to eat or drink, except water, 3 hours prior to arrival time.   NO caffeine/decaffeinated products, or chocolate 12 hours prior to arrival.  3. NO perfume, cologne or lotion  4. Total time is 1 to 2 hours; you may want to bring reading material for the waiting time.  5. Please report to Radiology at the [facility] Main Entrance 30 minutes early for your test.  [address] [place], [place] [address]  Diabetic Preparation:  Hold oral medications. You may take NPH and Lantus insulin. Do not take Humalog or Humulin R (Regular Insulin) the day of your test. Check blood sugars prior to leaving the house. If able to eat breakfast prior to 3 hour fasting, you may take all medications, including your insulin, Do not worry if you miss your breakfast dose  of insulin - start at your next meal.  IF YOU THINK YOU MAY BE PREGNANT, OR ARE NURSING PLEASE INFORM THE TECHNOLOGIST.  In preparation for your appointment, medication and supplies will be purchased.  Appointment availability is limited, so if you need to cancel or reschedule, please call the Radiology Department at [phone]  24 hours in advance to avoid a cancellation fee of $100.00  What to Expect After you Arrive:  Once you arrive and check in for your appointment, you will be taken to a preparation room within the Radiology Department.  A technologist or Nurse will obtain your medical history, verify that you are correctly prepped for the exam, and explain the procedure.  Afterwards,  an IV will be started in your arm and electrodes will be placed on your skin for EKG monitoring during the stress portion of the exam. Then you will be escorted to the PET/CT scanner.  There, staff will get you positioned on the scanner and obtain a blood pressure and EKG.  During the exam, you will continue to be connected to the EKG and blood pressure machines.  A small, safe amount of a radioactive tracer will be injected in your IV to obtain a series of pictures of your heart along with an injection of a stress agent.    After your Exam:  It is recommended that you eat a meal and drink a caffeinated beverage to counter act any effects of the stress agent.  Drink plenty of fluids for the remainder of the day and urinate frequently for the first couple of hours  after the exam.  Your doctor will inform you of your test results within 7-10 business days.  For questions about your test or how to prepare for your test, please call: Rockwell Alexandria, Cardiac Imaging Nurse Navigator  Larey Brick, Cardiac Imaging Nurse Navigator Office: [phone]     Signed, Smitty Knudsen  01/08/2023 11:36 PM    [facility]

## 2023-01-08 NOTE — Patient Instructions (Addendum)
Medication Instructions:   Your physician recommends that you continue on your current medications as directed. Please refer to the Current Medication list given to you today.  *If you need a refill on your cardiac medications before your next appointment, please call your pharmacy*  Lab Work: Marjie Skiff, PA-C recommends that you have lab work TODAY:  TSH  If you have labs (blood work) drawn today and your tests are completely normal, you will receive your results only by: MyChart Message (if you have MyChart) OR A paper copy in the mail If you have any lab test that is abnormal or we need to change your treatment, we will call you to review the results.   Testing/Procedures: Your physician has requested that you have an echocardiogram. Echocardiography is a painless test that uses sound waves to create images of your heart. It provides your doctor with information about the size and shape of your heart and how well your heart's chambers and valves are working. This procedure takes approximately one hour. There are no restrictions for this procedure. Please do NOT wear cologne, perfume, aftershave, or lotions (deodorant is allowed). Please arrive 15 minutes prior to your appointment time.  CARDIAC PET- Your physician has requested that you have a Cardiac Pet Stress Test. This testing is completed at [facility] ([address] [place], [place] [address]). The schedulers will call you to get this scheduled. Please follow instructions below and call the office with any questions/concerns [phone]).   Follow-Up: At [facility], you and your health needs are our priority.  As part of our continuing mission to provide you with exceptional heart care, we have created designated Provider Care Teams.  These Care Teams include your primary Cardiologist (physician) and Advanced Practice Providers (APPs -  Physician Assistants and Nurse Practitioners) who all  work together to provide you with the care you need, when you need it.  Your next appointment:   6 month(s)  Provider:   Lorine Bears, MD  or Marjie Skiff, PA-C        Other Instructions  How to Prepare for Your Cardiac PET/CT Stress Test:  1. Please do not take these medications before your test:   Medications that may interfere with the cardiac pharmacological stress agent (ex. nitrates - including erectile dysfunction medications, isosorbide mononitrate, tamulosin or beta-blockers) the day of the exam. (Erectile dysfunction medication should be held for at least 72 hrs prior to test) Theophylline containing medications for 12 hours. Dipyridamole 48 hours prior to the test. Your remaining medications may be taken with water.  2. Nothing to eat or drink, except water, 3 hours prior to arrival time.   NO caffeine/decaffeinated products, or chocolate 12 hours prior to arrival.  3. NO perfume, cologne or lotion  4. Total time is 1 to 2 hours; you may want to bring reading material for the waiting time.  5. Please report to Radiology at the [facility] Main Entrance 30 minutes early for your test.  [address] [place], [place] [address]  Diabetic Preparation:  Hold oral medications. You may take NPH and Lantus insulin. Do not take Humalog or Humulin R (Regular Insulin) the day of your test. Check blood sugars prior to leaving the house. If able to eat breakfast prior to 3 hour fasting, you may take all medications, including your insulin, Do not worry if you miss your breakfast dose of insulin - start at your next meal.  IF YOU  THINK YOU MAY BE PREGNANT, OR ARE NURSING PLEASE INFORM THE TECHNOLOGIST.  In preparation for your appointment, medication and supplies will be purchased.  Appointment availability is limited, so if you need to cancel or reschedule, please call the Radiology Department at [phone]  24 hours in advance to avoid a cancellation fee of  $100.00  What to Expect After you Arrive:  Once you arrive and check in for your appointment, you will be taken to a preparation room within the Radiology Department.  A technologist or Nurse will obtain your medical history, verify that you are correctly prepped for the exam, and explain the procedure.  Afterwards,  an IV will be started in your arm and electrodes will be placed on your skin for EKG monitoring during the stress portion of the exam. Then you will be escorted to the PET/CT scanner.  There, staff will get you positioned on the scanner and obtain a blood pressure and EKG.  During the exam, you will continue to be connected to the EKG and blood pressure machines.  A small, safe amount of a radioactive tracer will be injected in your IV to obtain a series of pictures of your heart along with an injection of a stress agent.    After your Exam:  It is recommended that you eat a meal and drink a caffeinated beverage to counter act any effects of the stress agent.  Drink plenty of fluids for the remainder of the day and urinate frequently for the first couple of hours after the exam.  Your doctor will inform you of your test results within 7-10 business days.  For questions about your test or how to prepare for your test, please call: Rockwell Alexandria, Cardiac Imaging Nurse Navigator  Larey Brick, Cardiac Imaging Nurse Navigator Office: [phone]

## 2023-01-10 LAB — TSH: TSH: 1.16 u[IU]/mL (ref 0.450–4.500)

## 2023-01-15 ENCOUNTER — Ambulatory Visit: Payer: Medicare PPO | Admitting: Student

## 2023-01-17 ENCOUNTER — Telehealth: Payer: Self-pay | Admitting: Student

## 2023-01-17 NOTE — Telephone Encounter (Signed)
Spoke with patient and discussed lab results.  Per Marjie Skiff, PA-C:  Hi Dr. Corine Shelter,   Good news, your TSH is normal. We will continue with plans for the Echo and cardiac PET stress test. If you have any questions or concerns about this, please let us know.   Porfirio Oar  Patient verbalized understanding and confirmed upcoming appointments for echo and cardiac PET test.

## 2023-01-28 ENCOUNTER — Telehealth (HOSPITAL_COMMUNITY): Payer: Self-pay | Admitting: Emergency Medicine

## 2023-01-28 NOTE — Telephone Encounter (Signed)
Attempted to call patient regarding upcoming cardiac PET appointment. Left message on voicemail with name and callback number Kathia Covington RN Navigator Cardiac Imaging [facility] [phone] Office [phone] Cell  

## 2023-01-29 ENCOUNTER — Ambulatory Visit (HOSPITAL_COMMUNITY)
Admission: RE | Admit: 2023-01-29 | Discharge: 2023-01-29 | Disposition: A | Discharge status: Home / Self Care | Payer: Medicare PPO | Source: Ambulatory Visit | Attending: Student | Admitting: Student

## 2023-01-29 DIAGNOSIS — R0609 Other forms of dyspnea: Secondary | ICD-10-CM | POA: Insufficient documentation

## 2023-01-29 LAB — NM PET CT CARDIAC PERFUSION MULTI W/ABSOLUTE BLOODFLOW
MBFR: 2.13
Nuc Rest EF: 56 %
Nuc Stress EF: 59 %
Rest MBF: 0.7 ml/g/min
ST Depression (mm): 0 mm
Stress MBF: 1.49 ml/g/min
TID: 0.11

## 2023-01-29 MED ORDER — RUBIDIUM RB82 GENERATOR (RUBYFILL)
24.9300 | PACK | Freq: Once | INTRAVENOUS | Status: AC
  Administered 2023-01-29: 24.93 via INTRAVENOUS

## 2023-01-29 MED ORDER — REGADENOSON 0.4 MG/5ML IV SOLN
0.4000 mg | Freq: Once | INTRAVENOUS | Status: AC
  Administered 2023-01-29: 0.4 mg via INTRAVENOUS

## 2023-01-29 MED ORDER — REGADENOSON 0.4 MG/5ML IV SOLN
INTRAVENOUS | Status: AC
  Filled 2023-01-29: qty 5

## 2023-01-29 MED ORDER — RUBIDIUM RB82 GENERATOR (RUBYFILL)
24.9100 | PACK | Freq: Once | INTRAVENOUS | Status: AC
  Administered 2023-01-29: 24.91 via INTRAVENOUS

## 2023-02-03 ENCOUNTER — Ambulatory Visit (HOSPITAL_COMMUNITY): Payer: Medicare PPO | Attending: Student

## 2023-02-03 DIAGNOSIS — R0609 Other forms of dyspnea: Secondary | ICD-10-CM | POA: Diagnosis present

## 2023-02-03 LAB — ECHOCARDIOGRAM COMPLETE
Area-P 1/2: 3.49 cm2
P 1/2 time: 507 msec
S' Lateral: 3.2 cm

## 2023-02-14 ENCOUNTER — Ambulatory Visit: Payer: Medicare PPO | Admitting: Adult Health

## 2023-02-14 ENCOUNTER — Encounter: Payer: Self-pay | Admitting: Internal Medicine

## 2023-02-18 ENCOUNTER — Telehealth: Payer: Self-pay

## 2023-02-18 NOTE — Telephone Encounter (Signed)
Patient walk in for questions regarding his Pet scan. Call to patient and he ask specifically regarding the "Severe coronary artery Calcification" and how much of a concern this is and what needs to be done. He states receiving results on My Chart but this is why he has questions as he saw this in results.   He does say he can be given response via my chart as he may be unable to answer the phone because he "doing doctor things"

## 2023-02-19 NOTE — Telephone Encounter (Signed)
Sent patient another MyChart message today with clarification about results.   Thank you!  Corrin Parker, PA-C 02/19/2023 2:30 PM

## 2023-02-19 NOTE — Telephone Encounter (Signed)
SEE RESULT NOTE 

## 2023-02-20 ENCOUNTER — Other Ambulatory Visit: Payer: Self-pay | Admitting: Internal Medicine

## 2023-02-20 DIAGNOSIS — R600 Localized edema: Secondary | ICD-10-CM

## 2023-03-18 ENCOUNTER — Encounter: Payer: Self-pay | Admitting: Internal Medicine

## 2023-04-05 ENCOUNTER — Other Ambulatory Visit: Payer: Medicare PPO

## 2023-09-09 ENCOUNTER — Ambulatory Visit: Payer: Medicare PPO | Attending: Cardiovascular Disease | Admitting: Cardiovascular Disease

## 2023-09-09 ENCOUNTER — Encounter: Payer: Self-pay | Admitting: Cardiovascular Disease

## 2023-09-09 VITALS — BP 128/72 | HR 68 | Ht 66.0 in | Wt 223.0 lb

## 2023-09-09 DIAGNOSIS — I2510 Atherosclerotic heart disease of native coronary artery without angina pectoris: Secondary | ICD-10-CM | POA: Diagnosis not present

## 2023-09-09 DIAGNOSIS — I10 Essential (primary) hypertension: Secondary | ICD-10-CM | POA: Diagnosis not present

## 2023-09-09 DIAGNOSIS — I480 Paroxysmal atrial fibrillation: Secondary | ICD-10-CM | POA: Diagnosis not present

## 2023-09-09 DIAGNOSIS — R0989 Other specified symptoms and signs involving the circulatory and respiratory systems: Secondary | ICD-10-CM

## 2023-09-09 DIAGNOSIS — I5032 Chronic diastolic (congestive) heart failure: Secondary | ICD-10-CM

## 2023-09-09 NOTE — Progress Notes (Signed)
Cardiology Office Note   Date:  09/09/2023   ID:  Steven Lex, Steven [name], DOB 11-30-1945, MRN [id number]  PCP:  [place], Steven [name]  Cardiologist: Kirke Corin  Chief Complaint  Patient presents with   Follow-up    6 months.   Shortness of Breath    At times.      History of Present Illness: Steven Lex, Steven [name] is a 77 y.o. male who presents for a follow-up visit regarding paroxysmal atrial fibrillation and coronary artery disease status post CABG.  He has been followed for paroxysmal atrial fibrillation. He moved from [place] in 2018. He is a retired OB/GYN Steven [name]. He has chronic medical conditions that include prostate cancer, essential hypertension, DM, previous TIA, hyperlipidemia and obesity. The patient had cardiac catheterization done at [facility] in October 2021 for severe exertional dyspnea which showed significant diffuse three-vessel coronary artery disease.  Left main disease was not significant, there was 80% diffuse calcified mid LAD disease, 60% ostial left circumflex disease and 70% stenosis in OM 2 and OM 3.  The RCA was occluded in the midsegment.  The patient underwent CABG in November, 2021 by Dr. Romona Curls with LIMA to LAD, radial to OM 2, SVG to OM 3 and distal RCA.  In addition, he underwent pulmonary vein ablation and left atrial appendage clipping.  He had postoperative bradycardia that necessitated decreasing the dose of metoprolol.  He was seen in April for increased shortness of breath and fatigue.  His labs showed mildly elevated BNP.  He was started on Lasix 20 mg once daily.  He underwent an echocardiogram which showed normal LV systolic function with mild aortic insufficiency.  A cardiac PET scan was negative for ischemia.  He is feeling better overall.  He is going to start Ozempic.  He denies chest pain, shortness of breath or palpitations.    Past Medical History:  Diagnosis Date   Cancer (HCC)    Carcinoma in situ of prostate    Coronary artery disease     Diabetes mellitus without complication (HCC)    Glaucoma 2019   History of transient ischemic attack (TIA)    Hyperlipidemia    Hypertension    Ischemia    Obesity    PAF (paroxysmal atrial fibrillation) (HCC)    Palpitations    Prostate CA (HCC)    SOB (shortness of breath)     Past Surgical History:  Procedure Laterality Date   BALLOON DILATION N/A 09/02/2022   Procedure: CYSTOSCOPY, BALLOON DILATION OF SUPRAPUBIC TUBE SITE, BLADDER BIOPSISES AND FULGERATION;  Surgeon: Heloise Purpura, Steven [name];  Location: [facility];  Service: Urology;  Laterality: N/A;  55 MINUTES NEEDED FOR CASE   CARDIAC CATHETERIZATION  06/2020   At [facility]: Three-vessel coronary artery disease with 80% calcified diffuse mid LAD disease, 60% ostial left circumflex, 70% stenosis in OM 3 and OM 2 and occluded mid RCA   CORONARY ARTERY BYPASS GRAFT  07/2020    CABG by Dr. Romona Curls with LIMA to LAD, radial to OM 2, SVG to OM 3 and distal RCA.  In addition, he underwent pulmonary vein ablation and left atrial appendage clipping.   IR CATHETER TUBE CHANGE  01/13/2018   PROSTATE SURGERY  10/2016   Seeds implanted     Current Outpatient Medications  Medication Sig Dispense Refill   albuterol (VENTOLIN HFA) 108 (90 Base) MCG/ACT inhaler Inhale into the lungs as needed.     bimatoprost (LUMIGAN) 0.01 % SOLN Place 1 drop into both  eyes at bedtime.     Ergocalciferol (VITAMIN D2 PO) Take 12,500 Units by mouth once a week.     folic acid (FOLVITE) 1 MG tablet Take 1 mg by mouth daily.     furosemide (LASIX) 20 MG tablet Take 20 mg by mouth daily.     JARDIANCE 10 MG TABS tablet Take 10 mg by mouth daily.     losartan (COZAAR) 50 MG tablet Take 50 mg by mouth daily.     metoprolol tartrate (LOPRESSOR) 25 MG tablet Take 12.5 mg by mouth 2 (two) times daily.     OZEMPIC, 0.25 OR 0.5 MG/DOSE, 2 MG/3ML SOPN      potassium chloride SA (KLOR-CON) 20 MEQ tablet Take 20 mEq by mouth daily.     XARELTO 20 MG TABS tablet Take 20 mg by mouth  daily.     rosuvastatin (CRESTOR) 40 MG tablet Take 1 tablet (40 mg total) by mouth daily. (Patient taking differently: Take 40 mg by mouth at bedtime.) 90 tablet 3   No current facility-administered medications for this visit.    Allergies:   Patient has no known allergies.    Social History:  The patient  reports that he has never smoked. He has never used smokeless tobacco. He reports current alcohol use. He reports that he does not use drugs.   Family History:  The patient's family history includes Breast cancer in his mother; Diabetes in his father; Stroke in his father.    ROS:  Please see the history of present illness.   Otherwise, review of systems are positive for none.   All other systems are reviewed and negative.    PHYSICAL EXAM: VS:  BP 128/72 (BP Location: Left Arm, Patient Position: Sitting, Cuff Size: Large)   Pulse 68   Ht 5\' 6"  (1.676 m)   Wt 223 lb (101.2 kg)   BMI 35.99 kg/m  , BMI Body mass index is 35.99 kg/m. GEN: Well nourished, well developed, in no acute distress  HEENT: normal  Neck: no JVD, or masses.  Right carotid bruit Cardiac: RRR; no murmurs, rubs, or gallops,no edema  Respiratory:  clear to auscultation bilaterally, normal work of breathing GI: soft, nontender, nondistended, + BS MS: no deformity or atrophy  Skin: warm and dry, no rash Neuro:  Strength and sensation are intact Psych: euthymic mood, full affect   EKG:  EKG is ordered today. The ekg ordered today demonstrates : Sinus rhythm with 1st degree A-V block When compared with ECG of 27-Dec-2022 18:12, PREVIOUS ECG IS PRESENT    Recent Labs: 12/27/2022: B Natriuretic Peptide 240.1; BUN 15; Creatinine, Ser 0.88; Hemoglobin 12.8; Platelets 157; Potassium 4.3; Sodium 140 01/08/2023: TSH 1.160    Lipid Panel    Component Value Date/Time   CHOL 163 03/05/2021 1153   TRIG 79 03/05/2021 1153   HDL 67 03/05/2021 1153   CHOLHDL 2.4 03/05/2021 1153   LDLCALC 81 03/05/2021 1153       Wt Readings from Last 3 Encounters:  09/09/23 223 lb (101.2 kg)  01/08/23 211 lb 6.4 oz (95.9 kg)  09/02/22 216 lb 0.8 oz (98 kg)          05/06/2017    9:02 AM  PAD Screen  Previous PAD dx? No  Previous surgical procedure? No  Pain with walking? No  Feet/toe relief with dangling? No  Painful, non-healing ulcers? No  Extremities discolored? No      ASSESSMENT AND PLAN:  1.  Coronary artery  disease involving native coronary arteries: status post CABG. he is doing well overall with no anginal symptoms.  Continue medical therapy.   2. Paroxysmal atrial fibrillation/flutter: He is maintaining in sinus rhythm.  Continue anticoagulation with Xarelto.  3. Essential hypertension: Blood pressures controlled on current medication.  4. Hyperlipidemia: Continue treatment with rosuvastatin with a target LDL of less than 70.  5.  Chronic diastolic heart failure: Symptoms improved with the addition of small dose Lasix.  6.  Right carotid bruit: I requested carotid Doppler.    Disposition:   FU with me in 6 months   Signed,  Lorine Bears, Steven [name]  09/09/2023 9:17 AM    [facility]

## 2023-09-09 NOTE — Patient Instructions (Signed)
Medication Instructions:  No changes *If you need a refill on your cardiac medications before your next appointment, please call your pharmacy*   Lab Work: None ordered If you have labs (blood work) drawn today and your tests are completely normal, you will receive your results only by: MyChart Message (if you have MyChart) OR A paper copy in the mail If you have any lab test that is abnormal or we need to change your treatment, we will call you to review the results.   Testing/Procedures: Your physician has requested that you have a carotid duplex. This test is an ultrasound of the carotid arteries in your neck. It looks at blood flow through these arteries that supply the brain with blood. Allow one hour for this exam. There are no restrictions or special instructions. This will take place at [address] [facility], [address].  Please note: We ask at that you not bring children with you during ultrasound (echo/ vascular) testing. Due to room size and safety concerns, children are not allowed in the ultrasound rooms during exams. Our front office staff cannot provide observation of children in our lobby area while testing is being conducted. An adult accompanying a patient to their appointment will only be allowed in the ultrasound room at the discretion of the ultrasound technician under special circumstances. We apologize for any inconvenience.    Follow-Up: At [facility], you and your health needs are our priority.  As part of our continuing mission to provide you with exceptional heart care, we have created designated Provider Care Teams.  These Care Teams include your primary Cardiologist (physician) and Advanced Practice Providers (APPs -  Physician Assistants and Nurse Practitioners) who all work together to provide you with the care you need, when you need it.  We recommend signing up for the patient portal called "MyChart".  Sign up information is provided on this After Visit  Summary.  MyChart is used to connect with patients for Virtual Visits (Telemedicine).  Patients are able to view lab/test results, encounter notes, upcoming appointments, etc.  Non-urgent messages can be sent to your provider as well.   To learn more about what you can do with MyChart, go to [email or web address].    Your next appointment:   6 month(s)  Provider:   Lorine Bears, MD

## 2023-09-26 ENCOUNTER — Encounter (HOSPITAL_COMMUNITY): Payer: Medicare PPO

## 2024-01-15 ENCOUNTER — Telehealth: Payer: Self-pay | Admitting: Cardiovascular Disease

## 2024-01-15 NOTE — Telephone Encounter (Signed)
 Spoke with patient, he had a stroke while on vacation and was treated at [facility] in [place], [place].

## 2024-01-15 NOTE — Telephone Encounter (Signed)
 Pt spouse called in stating he had a stroke and he asked to speak with nurse about it. He states he is not having any symptoms currently.

## 2024-01-27 ENCOUNTER — Ambulatory Visit: Attending: Cardiovascular Disease | Admitting: Cardiovascular Disease

## 2024-01-27 ENCOUNTER — Encounter: Payer: Self-pay | Admitting: Cardiovascular Disease

## 2024-01-27 VITALS — BP 125/75 | HR 69 | Ht 66.0 in | Wt 219.4 lb

## 2024-01-27 DIAGNOSIS — E785 Hyperlipidemia, unspecified: Secondary | ICD-10-CM

## 2024-01-27 DIAGNOSIS — I5032 Chronic diastolic (congestive) heart failure: Secondary | ICD-10-CM

## 2024-01-27 DIAGNOSIS — I2510 Atherosclerotic heart disease of native coronary artery without angina pectoris: Secondary | ICD-10-CM

## 2024-01-27 DIAGNOSIS — I10 Essential (primary) hypertension: Secondary | ICD-10-CM | POA: Diagnosis not present

## 2024-01-27 DIAGNOSIS — I480 Paroxysmal atrial fibrillation: Secondary | ICD-10-CM | POA: Diagnosis not present

## 2024-01-27 MED ORDER — APIXABAN 5 MG PO TABS: 5.0000 mg | ORAL_TABLET | Freq: Two times a day (BID) | ORAL | 5 refills | Status: AC

## 2024-01-27 NOTE — Patient Instructions (Signed)
 Medication Instructions:  Stop Xarelto  as directed Stop Asprin Stop Plavix Start Eliquis 5 mg twice daily  *If you need a refill on your cardiac medications before your next appointment, please call your pharmacy*  Lab Work: NONE ordered at this time of appointment   Testing/Procedures: NONE ordered at this time of appointment   Follow-Up: At [facility], you and your health needs are our priority.  As part of our continuing mission to provide you with exceptional heart care, our providers are all part of one team.  This team includes your primary Cardiologist (physician) and Advanced Practice Providers or APPs (Physician Assistants and Nurse Practitioners) who all work together to provide you with the care you need, when you need it.  Your next appointment:   6 month(s)  Provider:   Dr. Alvenia Aus     We recommend signing up for the patient portal called "MyChart".  Sign up information is provided on this After Visit Summary.  MyChart is used to connect with patients for Virtual Visits (Telemedicine).  Patients are able to view lab/test results, encounter notes, upcoming appointments, etc.  Non-urgent messages can be sent to your provider as well.   To learn more about what you can do with MyChart, go to [email or web address].

## 2024-01-27 NOTE — Progress Notes (Signed)
 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  He has been followed for paroxysmal atrial fibrillation. He moved from [place]  in 2018. He is a retired OB/GYN MD. He has chronic medical conditions that include prostate cancer, essential hypertension, DM, previous TIA, hyperlipidemia and obesity. The patient had cardiac catheterization done at [facility] in October 2021 for severe exertional dyspnea which showed significant diffuse three-vessel coronary artery disease.  Left main disease was not significant, there was 80% diffuse calcified mid LAD disease, 60% ostial left circumflex disease and 70% stenosis in OM 2 and OM 3.  The RCA was occluded in the midsegment.  The patient underwent CABG in November, 2021 by Dr. Rufino Coulter with LIMA to LAD, radial to OM 2, SVG to OM 3 and distal RCA.  In addition, he underwent pulmonary vein ablation and left atrial appendage clipping.  He had postoperative bradycardia that necessitated decreasing the dose of metoprolol .  He was seen in April, 2024 for increased shortness of breath and fatigue.  His labs showed mildly elevated BNP.  He was started on Lasix  20 mg once daily.  He underwent an echocardiogram which showed normal LV systolic function with mild aortic insufficiency.  A cardiac PET scan was negative for ischemia.   He was on vacation in [place]  recently and had sudden onset of slurred speech and left-sided weakness.  He was taken to the hospital there and was diagnosed with stroke.  However, his symptoms improved quickly and he was not given TNK.  He stopped taking Xarelto  months before that. He had carotid Doppler done  which showed less than 50% stenosis bilaterally.  CTA imaging showed incidental finding of right parotid mass the patient reports that this is chronic and he has known about this and its benign.  He was discharged on aspirin and clopidogrel and it was recommended for him to resume anticoagulation in the near future.     Past Medical History:  Diagnosis Date   Cancer (HCC)    Carcinoma in situ of prostate    Coronary artery disease    Diabetes mellitus without complication (HCC)    Glaucoma 2019   History of transient ischemic attack (TIA)    Hyperlipidemia    Hypertension    Ischemia    Obesity    PAF (paroxysmal atrial fibrillation) (HCC)    Palpitations    Prostate CA (HCC)    SOB (shortness of breath)     Past Surgical History:  Procedure Laterality Date   BALLOON DILATION N/A 09/02/2022   Procedure: CYSTOSCOPY, BALLOON DILATION OF SUPRAPUBIC TUBE SITE, BLADDER BIOPSISES AND FULGERATION;  Surgeon: Florencio Hunting, MD;  Location: [facility];  Service: Urology;  Laterality: N/A;  52 MINUTES NEEDED FOR CASE   CARDIAC CATHETERIZATION  06/2020   At [facility]: Three-vessel coronary artery disease with 80% calcified diffuse mid LAD disease, 60% ostial left circumflex, 70% stenosis in OM 3 and OM 2 and occluded mid RCA   CORONARY ARTERY BYPASS GRAFT  07/2020    CABG by Dr. Rufino Coulter with LIMA to LAD, radial to OM 2, SVG to OM 3 and distal RCA.  In  addition, he underwent pulmonary vein ablation and left atrial appendage clipping.   IR CATHETER TUBE CHANGE  01/13/2018   PROSTATE SURGERY  10/2016   Seeds implanted     Current Outpatient Medications  Medication Sig Dispense Refill   albuterol  (VENTOLIN  HFA) 108 (90 Base) MCG/ACT inhaler Inhale into the lungs as needed.     bimatoprost (LUMIGAN) 0.01 % SOLN Place 1 drop into both eyes at bedtime.     Ergocalciferol (VITAMIN D2 PO) Take 12,500 Units by mouth once a week.     folic acid (FOLVITE) 1 MG tablet Take 1 mg by mouth daily.     furosemide   (LASIX ) 20 MG tablet Take 20 mg by mouth daily.     JARDIANCE 10 MG TABS tablet Take 10 mg by mouth daily.     losartan (COZAAR) 50 MG tablet Take 50 mg by mouth daily.     OZEMPIC, 0.25 OR 0.5 MG/DOSE, 2 MG/3ML SOPN      potassium chloride SA (KLOR-CON) 20 MEQ tablet Take 20 mEq by mouth daily.     XARELTO  20 MG TABS tablet Take 20 mg by mouth daily.     metoprolol  tartrate (LOPRESSOR ) 25 MG tablet Take 12.5 mg by mouth 2 (two) times daily.     rosuvastatin  (CRESTOR ) 40 MG tablet Take 1 tablet (40 mg total) by mouth daily. (Patient taking differently: Take 40 mg by mouth at bedtime.) 90 tablet 3   No current facility-administered medications for this visit.    Allergies:   Patient has no known allergies.    Social History:  The patient  reports that he has never smoked. He has never used smokeless tobacco. He reports current alcohol use. He reports that he does not use drugs.   Family History:  The patient's family history includes Breast cancer in his mother; Diabetes in his father; Stroke in his father.    ROS:  Please see the history of present illness.   Otherwise, review of systems are positive for none.   All other systems are reviewed and negative.    PHYSICAL EXAM: VS:  BP 125/75 (BP Location: Left Arm, Patient Position: Sitting)   Pulse 69   Ht 5\' 6"  (1.676 m)   Wt 219 lb 6.4 oz (99.5 kg)   SpO2 98%   BMI 35.41 kg/m  , BMI Body mass index is 35.41 kg/m. GEN: Well nourished, well developed, in no acute distress  HEENT: normal  Neck: no JVD, or masses.  Bilateral carotid bruit Cardiac: RRR; no murmurs, rubs, or gallops,no edema  Respiratory:  clear to auscultation bilaterally, normal work of breathing GI: soft, nontender, nondistended, + BS MS: no deformity or atrophy  Skin: warm and dry, no rash Neuro:  Strength and sensation are intact Psych: euthymic mood, full affect   EKG:  EKG is ordered today. The ekg ordered today demonstrates : Sinus rhythm with 1st  degree A-V block Anteroseptal infarct , age undetermined When compared with ECG of 09-Sep-2023 09:03, No significant change was found    Recent Labs: No results found for requested labs within last 365 days.    Lipid Panel    Component Value Date/Time   CHOL 163 03/05/2021 1153   TRIG 79 03/05/2021 1153   HDL 67 03/05/2021 1153   CHOLHDL 2.4 03/05/2021 1153   LDLCALC 81 03/05/2021 1153      Wt Readings from Last 3 Encounters:  01/27/24 219 lb 6.4 oz (99.5 kg)  09/09/23 223 lb (101.2 kg)  01/08/23 211 lb 6.4 oz (95.9 kg)          05/06/2017    9:02 AM  PAD Screen  Previous PAD dx? No  Previous surgical procedure? No  Pain with walking? No  Feet/toe relief with dangling? No  Painful, non-healing ulcers? No  Extremities discolored? No      ASSESSMENT AND PLAN:  1.  Coronary artery disease involving native coronary arteries: status post CABG. he is doing well overall with no anginal symptoms.  Continue medical therapy.   2. Paroxysmal atrial fibrillation/flutter: He is maintaining in sinus rhythm.  Recent stroke was likely embolic in the setting of not taking Xarelto  for 1 month.  Carotid Doppler showed mild nonobstructive disease. He is currently on aspirin and clopidogrel.  I discussed with him the strong indication for anticoagulation and he is interested on apixaban.  Will start at 5 mg twice daily and not resume Xarelto .  I also asked him to stop aspirin and clopidogrel. His CHA2DS2-VASc score is at least 7.  Anticoagulation should not be interrupted without bridging.  3. Essential hypertension: Blood pressures controlled on current medication.  4. Hyperlipidemia: Continue treatment with rosuvastatin  with a target LDL of less than 70.  5.  Chronic diastolic heart failure: Symptoms improved with the addition of small dose Lasix .    Disposition:   FU with me in 6 months   Signed,  Antionette Kirks, MD  01/27/2024 10:22 AM    [facility]

## 2024-02-09 ENCOUNTER — Encounter: Payer: Self-pay | Admitting: Cardiovascular Disease

## 2024-03-02 ENCOUNTER — Ambulatory Visit: Payer: Medicare PPO | Admitting: Cardiovascular Disease

## 2024-04-02 ENCOUNTER — Encounter: Payer: Self-pay | Admitting: Cardiovascular Disease

## 2024-08-06 ENCOUNTER — Telehealth: Payer: Self-pay | Admitting: Cardiovascular Disease

## 2024-08-06 NOTE — Telephone Encounter (Signed)
 Patient c/o Palpitations:  STAT if patient reporting lightheadedness, shortness of breath, or chest pain  How long have you had palpitations/irregular HR/ Afib? Are you having the symptoms now? Not sure how long its been happening but states he has had episodes of afib   Are you currently experiencing lightheadedness, SOB or CP? No   Do you have a history of afib (atrial fibrillation) or irregular heart rhythm?  No   Have you checked your BP or HR? (document readings if available): did not take   Are you experiencing any other symptoms? No

## 2024-08-06 NOTE — Telephone Encounter (Signed)
 Left message to call back.

## 2024-08-18 ENCOUNTER — Encounter: Payer: Self-pay | Admitting: Psychology

## 2024-08-24 ENCOUNTER — Encounter: Payer: Self-pay | Admitting: Cardiovascular Disease

## 2024-08-24 ENCOUNTER — Ambulatory Visit: Attending: Cardiovascular Disease | Admitting: Cardiovascular Disease

## 2024-08-24 VITALS — BP 110/68 | HR 69 | Ht 66.0 in | Wt 211.0 lb

## 2024-08-24 DIAGNOSIS — I779 Disorder of arteries and arterioles, unspecified: Secondary | ICD-10-CM

## 2024-08-24 DIAGNOSIS — E785 Hyperlipidemia, unspecified: Secondary | ICD-10-CM | POA: Diagnosis not present

## 2024-08-24 DIAGNOSIS — I10 Essential (primary) hypertension: Secondary | ICD-10-CM

## 2024-08-24 DIAGNOSIS — I5032 Chronic diastolic (congestive) heart failure: Secondary | ICD-10-CM

## 2024-08-24 DIAGNOSIS — I480 Paroxysmal atrial fibrillation: Secondary | ICD-10-CM

## 2024-08-24 DIAGNOSIS — I2510 Atherosclerotic heart disease of native coronary artery without angina pectoris: Secondary | ICD-10-CM | POA: Diagnosis not present

## 2024-08-24 NOTE — Patient Instructions (Addendum)
 Medication Instructions:  STOP the Aspirin *If you need a refill on your cardiac medications before your next appointment, please call your pharmacy*  Lab Work: None ordered If you have labs (blood work) drawn today and your tests are completely normal, you will receive your results only by: MyChart Message (if you have MyChart) OR A paper copy in the mail If you have any lab test that is abnormal or we need to change your treatment, we will call you to review the results.  Testing/Procedures: Your physician has requested that you have a carotid duplex in 12 months. This test is an ultrasound of the carotid arteries in your neck. It looks at blood flow through these arteries that supply the brain with blood. Allow one hour for this exam. There are no restrictions or special instructions. This will take place at [address] 4th floor  Please note: We ask at that you not bring children with you during ultrasound (echo/ vascular) testing. Due to room size and safety concerns, children are not allowed in the ultrasound rooms during exams. Our front office staff cannot provide observation of children in our lobby area while testing is being conducted. An adult accompanying a patient to their appointment will only be allowed in the ultrasound room at the discretion of the ultrasound technician under special circumstances. We apologize for any inconvenience.   Follow-Up: At [facility], you and your health needs are our priority.  As part of our continuing mission to provide you with exceptional heart care, our providers are all part of one team.  This team includes your primary Cardiologist (physician) and Advanced Practice Providers or APPs (Physician Assistants and Nurse Practitioners) who all work together to provide you with the care you need, when you need it.  Your next appointment:   6 month(s)  Provider:   Deatrice Cage, MD    We recommend signing up for the patient  portal called MyChart.  Sign up information is provided on this After Visit Summary.  MyChart is used to connect with patients for Virtual Visits (Telemedicine).  Patients are able to view lab/test results, encounter notes, upcoming appointments, etc.  Non-urgent messages can be sent to your provider as well.   To learn more about what you can do with MyChart, go to [email or web address].

## 2024-08-24 NOTE — Progress Notes (Signed)
 Cardiology Office Note   Date:  08/24/2024   ID:  Steven KATHEE Music, MD, DOB 1946/02/11, MRN [id number]  PCP:  Roanna Ezekiel KATHEE, MD  Cardiologist: Darron Johns chief complaint on file.     History of Present Illness: Steven KATHEE Music, MD is a 78 y.o. male who presents for a follow-up visit regarding paroxysmal atrial fibrillation and coronary artery disease status post CABG.  He has been followed for paroxysmal atrial fibrillation. He moved from [place]  in 2018. He is a retired OB/GYN MD. He has chronic medical conditions that include prostate cancer, indwelling suprapubic catheter, essential hypertension, DM, previous TIA, hyperlipidemia and obesity. The patient had cardiac catheterization done at [facility] in October 2021 for severe exertional dyspnea which showed significant diffuse three-vessel coronary artery disease.  Left main disease was not significant, there was 80% diffuse calcified mid LAD disease, 60% ostial left circumflex disease and 70% stenosis in OM 2 and OM 3.  The RCA was occluded in the midsegment.  The patient underwent CABG in November, 2021 by Dr. Virgia with LIMA to LAD, radial to OM 2, SVG to OM 3 and distal RCA.  In addition, he underwent pulmonary vein ablation and left atrial appendage clipping.  He had postoperative bradycardia that necessitated decreasing the dose of metoprolol .  He was seen in April, 2024 for increased shortness of breath and fatigue.  His labs showed mildly elevated BNP.  He was started on Lasix  20 mg once daily.  He underwent an echocardiogram which showed normal LV systolic function with mild aortic insufficiency.  A cardiac PET scan was negative for ischemia.   He was on vacation in [place]  in April and had sudden onset of slurred speech and left-sided weakness.  He was taken to the hospital there and was diagnosed with stroke.  However, his symptoms improved quickly and he was not given TNK.  He stopped taking Xarelto  months before  that. He had carotid Doppler done which showed less than 50% stenosis bilaterally.  CTA imaging showed incidental finding of right parotid mass the patient reports that this is chronic and he has known about this and its benign.    During last visit, he was started on anticoagulation with Eliquis .  He has been doing well with no chest pain.  He reports intermittent palpitations associated with shortness of breath.  These episodes usually do not last long.  No issues with anticoagulation.     Past Medical History:  Diagnosis Date   Cancer (HCC)    Carcinoma in situ of prostate    Coronary artery disease    Diabetes mellitus without complication (HCC)    Glaucoma 2019   History of transient ischemic attack (TIA)    Hyperlipidemia    Hypertension    Ischemia    Obesity    PAF (paroxysmal atrial fibrillation) (HCC)    Palpitations    Prostate CA (HCC)    SOB (shortness of breath)     Past Surgical History:  Procedure Laterality Date   BALLOON DILATION N/A 09/02/2022   Procedure: CYSTOSCOPY, BALLOON DILATION OF SUPRAPUBIC TUBE SITE, BLADDER BIOPSISES AND FULGERATION;  Surgeon: Renda Glance, MD;  Location: [facility];  Service: Urology;  Laterality: N/A;  6 MINUTES NEEDED FOR CASE   CARDIAC CATHETERIZATION  06/2020   At [facility]: Three-vessel coronary artery disease with 80% calcified diffuse mid LAD disease, 60% ostial left circumflex, 70% stenosis in OM 3 and OM 2 and occluded mid RCA   CORONARY  ARTERY BYPASS GRAFT  07/2020    CABG by Dr. Virgia with LIMA to LAD, radial to OM 2, SVG to OM 3 and distal RCA.  In addition, he underwent pulmonary vein ablation and left atrial appendage clipping.   IR CATHETER TUBE CHANGE  01/13/2018   PROSTATE SURGERY  10/2016   Seeds implanted     Current Outpatient Medications  Medication Sig Dispense Refill   albuterol  (VENTOLIN  HFA) 108 (90 Base) MCG/ACT inhaler Inhale into the lungs as needed.     apixaban  (ELIQUIS ) 5 MG TABS tablet Take 1 tablet (5  mg total) by mouth 2 (two) times daily. 60 tablet 5   aspirin EC 81 MG tablet Take 81 mg by mouth daily. Swallow whole.     bimatoprost (LUMIGAN) 0.01 % SOLN Place 1 drop into both eyes at bedtime.     buPROPion (WELLBUTRIN XL) 150 MG 24 hr tablet Take 150 mg by mouth daily.     Ergocalciferol (VITAMIN D2 PO) Take 12,500 Units by mouth once a week.     ezetimibe (ZETIA) 10 MG tablet Take 10 mg by mouth daily.     folic acid (FOLVITE) 1 MG tablet Take 1 mg by mouth daily.     furosemide  (LASIX ) 20 MG tablet Take 20 mg by mouth daily.     JARDIANCE 10 MG TABS tablet Take 10 mg by mouth daily.     losartan (COZAAR) 50 MG tablet Take 50 mg by mouth daily.     metoprolol  tartrate (LOPRESSOR ) 25 MG tablet Take 12.5 mg by mouth 2 (two) times daily.     OZEMPIC, 0.25 OR 0.5 MG/DOSE, 2 MG/3ML SOPN      potassium chloride SA (KLOR-CON) 20 MEQ tablet Take 20 mEq by mouth daily.     rosuvastatin  (CRESTOR ) 40 MG tablet Take 1 tablet (40 mg total) by mouth daily. (Patient taking differently: Take 40 mg by mouth at bedtime.) 90 tablet 3   SV VITAMIN D3 25 MCG capsule Take 1,000 Units by mouth daily.     No current facility-administered medications for this visit.    Allergies:   Patient has no known allergies.    Social History:  The patient  reports that he has never smoked. He has never used smokeless tobacco. He reports current alcohol use. He reports that he does not use drugs.   Family History:  The patient's family history includes Breast cancer in his mother; Diabetes in his father; Stroke in his father.    ROS:  Please see the history of present illness.   Otherwise, review of systems are positive for none.   All other systems are reviewed and negative.    PHYSICAL EXAM: VS:  BP 110/68   Pulse 69   Ht 5' 6 (1.676 m)   Wt 211 lb (95.7 kg)   SpO2 96%   BMI 34.06 kg/m  , BMI Body mass index is 34.06 kg/m. GEN: Well nourished, well developed, in no acute distress  HEENT: normal  Neck:  no JVD, or masses.  Bilateral carotid bruit louder on the right side Cardiac: RRR; no murmurs, rubs, or gallops,no edema  Respiratory:  clear to auscultation bilaterally, normal work of breathing GI: soft, nontender, nondistended, + BS MS: no deformity or atrophy  Skin: warm and dry, no rash Neuro:  Strength and sensation are intact Psych: euthymic mood, full affect   EKG:  EKG is ordered today. The ekg ordered today demonstrates : Sinus rhythm with 1st degree A-V  block Anteroseptal infarct , age undetermined When compared with ECG of 09-Sep-2023 09:03, No significant change was found    Recent Labs: No results found for requested labs within last 365 days.    Lipid Panel    Component Value Date/Time   CHOL 163 03/05/2021 1153   TRIG 79 03/05/2021 1153   HDL 67 03/05/2021 1153   CHOLHDL 2.4 03/05/2021 1153   LDLCALC 81 03/05/2021 1153      Wt Readings from Last 3 Encounters:  08/24/24 211 lb (95.7 kg)  01/27/24 219 lb 6.4 oz (99.5 kg)  09/09/23 223 lb (101.2 kg)          05/06/2017    9:02 AM  PAD Screen  Previous PAD dx? No  Previous surgical procedure? No  Pain with walking? No  Feet/toe relief with dangling? No  Painful, non-healing ulcers? No  Extremities discolored? No      ASSESSMENT AND PLAN:  1.  Coronary artery disease involving native coronary arteries: status post CABG. he is doing well overall with no anginal symptoms.  Continue medical therapy.   2. Paroxysmal atrial fibrillation/flutter: He is maintaining in sinus rhythm.  He does report intermittent episodes of palpitations that do not last long.  Continue anticoagulation with Eliquis .  I asked him to discontinue aspirin to minimize the risk of bleeding.   His CHA2DS2-VASc score is at least 7.  Anticoagulation should not be interrupted without bridging. I reviewed his most recent labs done with his primary care physician in October which showed normal CBC and stable renal function with a  creatinine of 1.28.  3. Essential hypertension: Blood pressures controlled on current medication.  4. Hyperlipidemia: Continue treatment with rosuvastatin  and ezetimibe with a target LDL of less than 70.  Most recent lipid profile showed an LDL of 46.  5.  Chronic diastolic heart failure: He appears to be euvolemic on small dose furosemide .  6.  Carotid bruits: Louder on the right side with previous Doppler showing mild nonobstructive disease.  Repeat study in 1 year.   Disposition:   FU with me in 12 months   Signed,  Deatrice Cage, MD  08/24/2024 10:51 AM    [facility]

## 2024-09-03 ENCOUNTER — Telehealth: Payer: Self-pay | Admitting: Cardiovascular Disease

## 2024-09-03 NOTE — Telephone Encounter (Signed)
 Left message for patient to call back

## 2024-09-03 NOTE — Telephone Encounter (Signed)
 Pt requesting a message sent to Dr. Darron to see if he could see pt wife as soon as possible. Please advise.

## 2024-09-06 NOTE — Telephone Encounter (Signed)
 Pt returning call about making his wife any appt. Please advise if its okay to schedule New Patient (gen card) for Dr. Darron in [place]. He states his wife wants to be seen for irregular heartbeat and to rule out afib.    I don't mind scheduling the appointment, if it's okay.

## 2024-10-13 ENCOUNTER — Encounter: Attending: Psychology | Admitting: Psychology

## 2024-10-13 DIAGNOSIS — R413 Other amnesia: Secondary | ICD-10-CM | POA: Insufficient documentation

## 2024-10-13 NOTE — Progress Notes (Signed)
 "  NEUROPSYCHOLOGICAL EVALUATION 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 Sex: male  Race/Ethnicity: Black or African American  Years of Ed.: 20  Collateral Source: It Consultant (spouse)  Referring Provider: Roanna Ezekiel NOVAK, MD Provider / Neuropsychologist: Evalene DOROTHA Riff, PsyD  Date of Service: 10/13/2024 Start: 3 PM End: 5 PM Location:  [place]. [facility] Physical Medicine & Rehabilitation Department [address], [address], [place], [place] [address] Billing Code/Service: [phone] (1 Unit), [phone] (1 Unit) 1 hour and 15 minutes spent in face-to-face clinical interview and remaining 45 minutes was spent in record review, documentation, and testing protocol construction.   Individuals Present: The patient was seen by the provider, in-person, in the provider's outpatient office. The patient was unaccompanied. He provided consent for the provider to contact his spouse by phone for collateral interview. Report will be updated upon completion.   PATIENT CONSENT AND CONFIDENTIALITY The patient's understanding of the reason for referral was intact. Discussed limits of confidentiality including, but not limited to, posting of final evaluation report in the patient's electronic medical record for both the patient and for the referring provider and appropriate medical professionals. Patient was given the opportunity to have their questions answered. The neuropsychological evaluation process was discussed with the patient and they consented to proceed with the evaluation.  Consent for Evaluation and Treatment: Signed: Yes Explanation of Privacy Policies: Signed: Yes Discussion of Confidentiality Limits: Yes  REASON FOR REFERRAL & RECORD REVIEW The patient was referred for neuropsychological evaluation by his primary care provider, Dr. Roanna, due to concerns for cognitive  decline. 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 Cognitive complaints involve subjective declines from (suspected above average) premorbid cognitive abilities. He remains very high functioning, but has noticed some declines in performance on tasks of relatively high cognitive complexity. Onset of cognitive changes occurred some time after 2025 CVA. Medical records from the patient's hospitalization with [facility] were available for review. Per HPI within those records, the patient presented to the ER for evaluation of neurologic symptoms.  Problems involved slurred speech, left-sided numbness, and inability to recognize his left arm as his own prior to arrival to the hospital.  EMS ported symptom improvements that was significant.  Patient endorsed improvement in symptoms as well although some degree of left-sided numbness continued.  The patient was fully oriented. CTA results per records indicated Mixed a matched and mismatch defect at the right perirolandic region with a penumbra measuring 17 mL.  There is an area of focal thrombotic occlusion in the distal right M2 branch with almost immediate reconstitution of flow.  Moderate focal stenosis in the P1 segments bilaterally.  There is mild calcification atherosclerotic stenosis at the carotid bifurcations.  Right parotid mass measuring up to 2.7 cm.  MRI with and without contrast reported  small, acute non-- hemorrhagic cortical infarct of the right MCA territory.  No mass effect.  Suspected cavernoma and right hemorrhage of the right thalamus.  Mild chronic microangiopathy. Head CT and CTA/perfusion findings; small ischemic cortical infarct of the right posterior insula and right parietal peri insular cortex... chronic lacunar infarct of left pons. Records report only sensory deficits on exam.   Upon interview, the patient expressed  understanding of the reason for the referral. He offered  that he suspects etiology for his cognitive changes are vascular/cerebrovascular, but also recognizes relevance of contextual factors in manifestation of cognitive changes (I.e., depression). The patient demonstrated knowledge of standard recommendations for promotion of cognitive health / reducing risk of cognitive decline (e.g., cognitive stimulation, physical health, social engagement, etc.). 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 He indicated that he expects his cognition and memory to continue to decline.    HISTORY OF PRESENTING CONCERNS:  Cognitive Symptom Onset & Course: Gradual onset starting approximately six months ago. He is uncertain about course (progressive vs. stable vs. fluctuating). He indicated that cognitive issues did not start with CVA (April 2025), but occurred some time after that. Denied any distinct precipitating events.   Current Cognitive Complaints:  Memory:  Patient: Increased reliance on written notes to support memory. Increased difficulty recalling whether he has completed a task or not. No marked changes in abilities to recall conversations or notable recent events. Changes in memory are significant, subjectively. On instance of possible topographical disorientation approximately 1.5 months ago, although this was not in a familiar location. He reported that his spouse has noticed cognitive changes.  Collateral: -   Processing Speed:  Patient: Endorsed significantly reduced processing speed.  Collateral: -   Attention & Concentration: Patient: Possible, very mild, premorbid attention difficulties. Currently notes difficulties losing train of thought, managing distraction, need to rely more heavily on to-do lists. Issues reflect a clear decline from baseline, subjectively.  Collateral: -   Language:   Patient: Endorsed significant declines in language functioning, within the context of strong premorbid abilities in this domain. Endorsed word finding and speed changes. Denied changes in receptive language. No changes in mental arithmetic.  Collateral: -   Visual-Spatial:  Patient: No clear declines in visual-spatial abilities. No issues with navigation or utilizing GPS. He travels frequently as part of his job. Some increased incidence of hitting the curb, although has owned vehicle for only ~1 year.  Executive Functioning:  Patient: No striking changes in executive functioning. Strong abilities at baseline. Intact problem solving. No problems with technology, including learning to use different OS/devices. No impulsivity or striking behavioral changes.  Collateral: -    Motor/Sensory Complaints:  Sensory changes: Hearing loss, nothing major.Good. Loss sense of smell 10 year. Maybe viral? Not TBI.  Balance/coordination difficulties: Balance, long running. Doing stairs one step at a time. No falls.  Frequent instances of dizziness/vertigo: Occasional.  Other motor difficulties: No change in handwriting. No tremor. Typing is low.   Emotional and Behavioral Functioning:  He reported onset of depression which occurred in the last couple of months. No clear precipitating event. He denied chronic feelings of sadness or problems with anhedonia directly. He endorsed struggles with irritability, reduced drive to complete tasks, possible tendencies toward social isolation/withdrawal, and declines in confidence in his own abilities. He described increased thought about morality, quality of life, and legacy/supporting his family. He reported no indications of active suicidality. He denied any problems with hopelessness or excessive guilt. He had some brief engagements with mental health treatment in the past, typically related to situational factors. His lifestyle is quite active overall. He engages in  dealer for wellness. The patient denied any significant anxiety related symptomatology. No history of panic. No PTSD. Denied any experiences of hallucination, delusions/paranoia, indications of mania, homicidal ideation, and has no history of inpatient psychiatric admission. He reported minimal stress.  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 He reported feeling some tension around completing this, and would like to be progressing at a better rate than currently. He recently began on bupropion and feels this has provided some benefit.   Sleep: About 5-6 hours of sleep per night regularly. Occasionally 8 gets about 8 hours of sleep.  Typically feels adequately rested in the morning.  No problems with onset or maintenance.  Some mild changes in sleep behavior over the last month connected to vivid dreams and possible increase in movements while asleep.   Appetite: On a GLP-1. No significant problems with reduced or increased appetite.   Caffeine: Started drinking caffiene about a year ago. Down to about a cup a day.   Alcohol Use: ~1 beer weekly. 1x-2x/time weekly.  Tobacco Use: Former; quit 1986 Recreational Substance Use: No.    Academic/Vocational History: No learning difficulties growing up, I was a nerd. Performed quite well in school. 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 He works around 2 days a week, sees 4-5 patients a day, working a total of ~20 hours per week, and working every other week.   Psychosocial: Marital Status: Married to his wife since 69.    Children/Grandchildren: 3 adult children. 7 grandchildren.    Living Situation: In his home with his wife.    Daily Activities/Hobbies: [name]. Spending time with family.      Level of Functional Independence: The patient is intact with basic and instrumental activities of daily living.   Medical History/Record Review: Per records and patient report, History of traumatic brain injury/concussion: remote.    History of stroke: TIA when in [place] in [place], no residual Sx.  2025.   History of cancer/chemotherapy: No chemotherapy.  History of seizure activity: no   Symptoms of chronic pain: no   Experience of frequent headaches/migraines: no    Problem List (Per referring provider records) Type 2 diabetes mellitus Major depressive disorder, single episode Hyperlipidemia Vitamin B12 level below reference range Chronic kidney disease (stage 3A) due to type 2 diabetes mellitus Asthma Multiple vessel coronary artery disease Neoplasm of the parotid gland Vitamin D deficiency Ischemic stroke Hypertensive disorder Atrial fibrillation Hyperglycemia due to type 2 diabetes mellitus Cardiac volume overload Cobalamin deficiency Aortic incompetence, nonrheumatic  Imaging/Lab Results: (See record review) Past Medical History:  Diagnosis Date   Cancer (HCC)    Carcinoma in situ of prostate    Coronary artery disease    Diabetes mellitus without complication (HCC)    Glaucoma 2019   History of transient ischemic attack (TIA)    Hyperlipidemia    Hypertension    Ischemia    Obesity    PAF (paroxysmal atrial fibrillation) (HCC)    Palpitations    Prostate CA (HCC)    SOB (shortness of breath)    Family Neurologic/Medical Hx: Per records; dementia in mother (also CVA), 80. MCI three years before passing. Did receive breast cancer treatment (possible cog Sfx?) Family History  Problem Relation Age of Onset   Breast cancer Mother    Diabetes Father    Stroke Father    Medications:  Albuterol  sulfate HFA 90 mcg Bupropion HCl XL 150 mg Clopidogrel 75 mg Clotrimazole-betamethasone topical cream Cyanocobalamin  1000 mcg Daily multivitamin Eliquis  5  mg Ezetimibe 10 mg  Flovent HFA 110 mcg Folic acid 1 mg Furosemide  20 mg  Jardiance 10 mg  losartan 50 mg Lumigan eyedrops Metoprolol  tartrate 25 mg Ozempic 0.25 or 0.5 mg Potassium chloride ER 20 meq Rosuvastatin  40 mg Vitamin D3 25 mcg  Mental Status/Behavioral Observations: The patient was seen on an outpatient basis in the [facility] PM[facility]R office for the clinical interview unaccompanied. Sensorium/Arousal: Hearing and vision are intact for the purpose of the evaluation.  Patient was alert. Orientation: Intact. Appearance: Appropriate dress and hygiene. Behavior: Cooperative, engaged, attentive. Speech/Language: Conversational speech prosodic, fluent, and well-articulated.  No clear indications of difficulties with receptive language in conversation. Motor: Within normal limits. Social Comportment: Appropriate for the setting. Mood: Euthymic. Affect: Congruent. Thought Process/Content: Coherent, linear, and goal directed. Ability to Participate in Interview: Readily answered all questions posed during clinical interview with adequate detail regarding personal history. Insight: Intact.  SUMMARY / CLINICAL IMPRESSIONS The patient was referred for neuropsychological evaluation by his primary care provider, Dr. Roanna, due to concerns for cognitive decline. 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 Cognitive complaints involve subjective declines from (suspected above average) premorbid cognitive abilities. He remains very high functioning, but has noticed some declines in performance on tasks of relatively high cognitive complexity. Onset of cognitive changes occurred some time after 2025 CVA. Medical records from the patient's hospitalization with [facility] were available for review. Per HPI within those records, the patient presented to the ER for evaluation of  neurologic symptoms.  Problems involved slurred speech, left-sided numbness, and inability to recognize his left arm as his own prior to arrival to the hospital.  EMS ported symptom improvements that was significant.  Patient endorsed improvement in symptoms as well although some degree of left-sided numbness continued.  The patient was fully oriented. CTA results per records indicated Mixed a matched and mismatch defect at the right perirolandic region with a penumbra measuring 17 mL.  There is an area of focal thrombotic occlusion in the distal right M2 branch with almost immediate reconstitution of flow.  Moderate focal stenosis in the P1 segments bilaterally.  There is mild calcification atherosclerotic stenosis at the carotid bifurcations.  Right parotid mass measuring up to 2.7 cm.  MRI with and without contrast reported  small, acute non-- hemorrhagic cortical infarct of the right MCA territory.  No mass effect.  Suspected cavernoma and right hemorrhage of the right thalamus.  Mild chronic microangiopathy. Head CT and CTA/perfusion findings; small ischemic cortical infarct of the right posterior insula and right parietal peri insular cortex... chronic lacunar infarct of left pons. Records report only sensory deficits on exam.   Upon interview, the patient expressed understanding of the reason for the referral. He offered that he suspects etiology for his cognitive changes are vascular/cerebrovascular, but also recognizes relevance of contextual factors in manifestation of cognitive changes (I.e., depression). The patient demonstrated knowledge of standard recommendations for promotion of cognitive health / reducing risk of cognitive decline (e.g., cognitive stimulation, physical health, social engagement, etc.). 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 He indicated that he expects his  cognition and memory to continue to decline.   DISPOSITION / PLAN The patient has been set up for a formal neuropsychological assessment to objectively assess her cognitive functioning across domains to establish the  patient's cognitive profile. This data, in conjunction with information obtained via clinical interview and medical record review, will help clarify likely etiology and guide treatment recommendations. Once data collection and interpretation have been completed, the findings / diagnosis and recommendations will be reviewed and discussed with the patient during a feedback appointment with the neuropsychologist. Based on the collaborative dialogue with the patient during the feedback, recommendations may be adjusted / tailored as needed. A formal report will be produced and provided to the patient and the referring provider.   Diagnosis: Memory loss History of CVA   FULL REPORT TO FOLLOW COMPLETION OF COGNITIVE TESTING AND FEEDBACK  The patient provided consent for the writer to communicate with his wife for collateral interview. Report will be updated upon completion of collateral interview.    Evalene DOROTHA Riff, PsyD [facility] [address], [address] [place], [place] [address] Main: [phone] Fax: [phone] [place] License # [id number]  This report was generated using voice recognition software. While this document has been carefully reviewed, transcription errors may be present. I apologize in advance for any inconvenience. Please contact me if further clarification is needed.  "

## 2024-10-14 ENCOUNTER — Encounter: Payer: Self-pay | Admitting: Psychology

## 2024-11-01 ENCOUNTER — Encounter: Attending: Psychology

## 2024-11-04 ENCOUNTER — Encounter: Admitting: Psychology

## 2024-11-12 ENCOUNTER — Encounter: Admitting: Psychology
